# Patient Record
Sex: Female | Born: 1945
Health system: Southern US, Community
[De-identification: ages and names within clinical notes are randomized; demographics above are authoritative.]

## PROBLEM LIST (undated history)

## (undated) DIAGNOSIS — K219 Gastro-esophageal reflux disease without esophagitis: Secondary | ICD-10-CM

## (undated) DIAGNOSIS — R251 Tremor, unspecified: Secondary | ICD-10-CM

## (undated) DIAGNOSIS — E079 Disorder of thyroid, unspecified: Secondary | ICD-10-CM

## (undated) DIAGNOSIS — F329 Major depressive disorder, single episode, unspecified: Secondary | ICD-10-CM

## (undated) DIAGNOSIS — D649 Anemia, unspecified: Secondary | ICD-10-CM

## (undated) DIAGNOSIS — F419 Anxiety disorder, unspecified: Secondary | ICD-10-CM

## (undated) DIAGNOSIS — F32A Depression, unspecified: Secondary | ICD-10-CM

## (undated) DIAGNOSIS — I10 Essential (primary) hypertension: Secondary | ICD-10-CM

## (undated) HISTORY — DX: Depression, unspecified: F32.A

## (undated) HISTORY — PX: MIDDLE EAR SURGERY: SHX713

## (undated) HISTORY — DX: Anemia, unspecified: D64.9

## (undated) HISTORY — DX: Major depressive disorder, single episode, unspecified: F32.9

## (undated) HISTORY — PX: HAND SURGERY: SHX662

## (undated) HISTORY — PX: THYROIDECTOMY: SHX17

## (undated) HISTORY — DX: Essential (primary) hypertension: I10

## (undated) HISTORY — DX: Tremor, unspecified: R25.1

## (undated) HISTORY — PX: OTHER SURGICAL HISTORY: SHX169

## (undated) HISTORY — DX: Gastro-esophageal reflux disease without esophagitis: K21.9

## (undated) HISTORY — PX: FOOT SURGERY: SHX648

## (undated) HISTORY — PX: ABDOMINAL HYSTERECTOMY: SHX81

## (undated) HISTORY — DX: Disorder of thyroid, unspecified: E07.9

## (undated) HISTORY — DX: Anxiety disorder, unspecified: F41.9

## (undated) HISTORY — PX: INNER EAR SURGERY: SHX679

---

## 1981-03-02 HISTORY — PX: ABDOMINAL HYSTERECTOMY: SHX81

## 1988-10-31 HISTORY — PX: THYROIDECTOMY: SHX17

## 2003-12-27 ENCOUNTER — Ambulatory Visit: Payer: Self-pay | Admitting: Obstetrics and Gynecology

## 2004-03-25 ENCOUNTER — Ambulatory Visit: Payer: Self-pay | Admitting: Family Medicine

## 2004-05-20 ENCOUNTER — Ambulatory Visit: Payer: Self-pay | Admitting: General Surgery

## 2004-10-21 ENCOUNTER — Ambulatory Visit: Payer: Self-pay | Admitting: Family Medicine

## 2005-02-17 ENCOUNTER — Ambulatory Visit: Payer: Self-pay | Admitting: Family Medicine

## 2005-05-26 ENCOUNTER — Ambulatory Visit: Payer: Self-pay | Admitting: Family Medicine

## 2005-06-18 ENCOUNTER — Ambulatory Visit: Payer: Self-pay | Admitting: Obstetrics and Gynecology

## 2005-09-25 ENCOUNTER — Ambulatory Visit: Payer: Self-pay | Admitting: Family Medicine

## 2005-10-02 ENCOUNTER — Ambulatory Visit: Payer: Self-pay | Admitting: Family Medicine

## 2006-02-03 ENCOUNTER — Ambulatory Visit: Payer: Self-pay | Admitting: Obstetrics and Gynecology

## 2006-02-10 ENCOUNTER — Ambulatory Visit: Payer: Self-pay | Admitting: Podiatry

## 2006-06-24 ENCOUNTER — Ambulatory Visit: Payer: Self-pay | Admitting: Obstetrics and Gynecology

## 2006-08-02 ENCOUNTER — Ambulatory Visit: Payer: Self-pay | Admitting: Family Medicine

## 2007-03-11 ENCOUNTER — Ambulatory Visit: Payer: Self-pay | Admitting: Family Medicine

## 2007-06-27 ENCOUNTER — Ambulatory Visit: Payer: Self-pay | Admitting: Obstetrics and Gynecology

## 2007-07-08 ENCOUNTER — Ambulatory Visit: Payer: Self-pay | Admitting: Family Medicine

## 2008-07-05 ENCOUNTER — Ambulatory Visit: Payer: Self-pay | Admitting: Obstetrics and Gynecology

## 2009-04-09 ENCOUNTER — Ambulatory Visit: Payer: Self-pay | Admitting: Family Medicine

## 2009-05-30 ENCOUNTER — Ambulatory Visit (HOSPITAL_BASED_OUTPATIENT_CLINIC_OR_DEPARTMENT_OTHER): Admission: RE | Admit: 2009-05-30 | Discharge: 2009-05-30 | Payer: Self-pay | Admitting: Urology

## 2009-07-17 ENCOUNTER — Ambulatory Visit: Payer: Self-pay | Admitting: Obstetrics and Gynecology

## 2009-07-31 ENCOUNTER — Ambulatory Visit: Payer: Self-pay | Admitting: Obstetrics and Gynecology

## 2010-05-26 LAB — POCT HEMOGLOBIN-HEMACUE: Hemoglobin: 10.8 g/dL — ABNORMAL LOW (ref 12.0–15.0)

## 2010-06-20 LAB — HM MAMMOGRAPHY

## 2010-08-13 ENCOUNTER — Ambulatory Visit: Payer: Self-pay | Admitting: Obstetrics and Gynecology

## 2010-08-19 ENCOUNTER — Ambulatory Visit: Payer: Self-pay | Admitting: Obstetrics and Gynecology

## 2011-03-24 DIAGNOSIS — M47812 Spondylosis without myelopathy or radiculopathy, cervical region: Secondary | ICD-10-CM | POA: Diagnosis not present

## 2011-03-24 DIAGNOSIS — M999 Biomechanical lesion, unspecified: Secondary | ICD-10-CM | POA: Diagnosis not present

## 2011-03-24 DIAGNOSIS — M5430 Sciatica, unspecified side: Secondary | ICD-10-CM | POA: Diagnosis not present

## 2011-03-24 DIAGNOSIS — IMO0002 Reserved for concepts with insufficient information to code with codable children: Secondary | ICD-10-CM | POA: Diagnosis not present

## 2011-03-24 DIAGNOSIS — M9981 Other biomechanical lesions of cervical region: Secondary | ICD-10-CM | POA: Diagnosis not present

## 2011-03-25 DIAGNOSIS — M9981 Other biomechanical lesions of cervical region: Secondary | ICD-10-CM | POA: Diagnosis not present

## 2011-03-25 DIAGNOSIS — IMO0002 Reserved for concepts with insufficient information to code with codable children: Secondary | ICD-10-CM | POA: Diagnosis not present

## 2011-03-25 DIAGNOSIS — M5430 Sciatica, unspecified side: Secondary | ICD-10-CM | POA: Diagnosis not present

## 2011-03-25 DIAGNOSIS — M47812 Spondylosis without myelopathy or radiculopathy, cervical region: Secondary | ICD-10-CM | POA: Diagnosis not present

## 2011-03-25 DIAGNOSIS — M999 Biomechanical lesion, unspecified: Secondary | ICD-10-CM | POA: Diagnosis not present

## 2011-03-30 DIAGNOSIS — M47812 Spondylosis without myelopathy or radiculopathy, cervical region: Secondary | ICD-10-CM | POA: Diagnosis not present

## 2011-03-30 DIAGNOSIS — IMO0002 Reserved for concepts with insufficient information to code with codable children: Secondary | ICD-10-CM | POA: Diagnosis not present

## 2011-03-30 DIAGNOSIS — M9981 Other biomechanical lesions of cervical region: Secondary | ICD-10-CM | POA: Diagnosis not present

## 2011-03-30 DIAGNOSIS — M999 Biomechanical lesion, unspecified: Secondary | ICD-10-CM | POA: Diagnosis not present

## 2011-03-30 DIAGNOSIS — M5430 Sciatica, unspecified side: Secondary | ICD-10-CM | POA: Diagnosis not present

## 2011-04-01 DIAGNOSIS — M47812 Spondylosis without myelopathy or radiculopathy, cervical region: Secondary | ICD-10-CM | POA: Diagnosis not present

## 2011-04-01 DIAGNOSIS — M9981 Other biomechanical lesions of cervical region: Secondary | ICD-10-CM | POA: Diagnosis not present

## 2011-04-01 DIAGNOSIS — M999 Biomechanical lesion, unspecified: Secondary | ICD-10-CM | POA: Diagnosis not present

## 2011-04-01 DIAGNOSIS — IMO0002 Reserved for concepts with insufficient information to code with codable children: Secondary | ICD-10-CM | POA: Diagnosis not present

## 2011-04-01 DIAGNOSIS — M5430 Sciatica, unspecified side: Secondary | ICD-10-CM | POA: Diagnosis not present

## 2011-04-06 DIAGNOSIS — M999 Biomechanical lesion, unspecified: Secondary | ICD-10-CM | POA: Diagnosis not present

## 2011-04-06 DIAGNOSIS — IMO0002 Reserved for concepts with insufficient information to code with codable children: Secondary | ICD-10-CM | POA: Diagnosis not present

## 2011-04-06 DIAGNOSIS — M47812 Spondylosis without myelopathy or radiculopathy, cervical region: Secondary | ICD-10-CM | POA: Diagnosis not present

## 2011-04-06 DIAGNOSIS — M9981 Other biomechanical lesions of cervical region: Secondary | ICD-10-CM | POA: Diagnosis not present

## 2011-04-06 DIAGNOSIS — M5430 Sciatica, unspecified side: Secondary | ICD-10-CM | POA: Diagnosis not present

## 2011-04-08 DIAGNOSIS — M47812 Spondylosis without myelopathy or radiculopathy, cervical region: Secondary | ICD-10-CM | POA: Diagnosis not present

## 2011-04-08 DIAGNOSIS — M999 Biomechanical lesion, unspecified: Secondary | ICD-10-CM | POA: Diagnosis not present

## 2011-04-08 DIAGNOSIS — M9981 Other biomechanical lesions of cervical region: Secondary | ICD-10-CM | POA: Diagnosis not present

## 2011-04-08 DIAGNOSIS — IMO0002 Reserved for concepts with insufficient information to code with codable children: Secondary | ICD-10-CM | POA: Diagnosis not present

## 2011-04-08 DIAGNOSIS — M5430 Sciatica, unspecified side: Secondary | ICD-10-CM | POA: Diagnosis not present

## 2011-04-15 DIAGNOSIS — M999 Biomechanical lesion, unspecified: Secondary | ICD-10-CM | POA: Diagnosis not present

## 2011-04-15 DIAGNOSIS — M47812 Spondylosis without myelopathy or radiculopathy, cervical region: Secondary | ICD-10-CM | POA: Diagnosis not present

## 2011-04-15 DIAGNOSIS — IMO0002 Reserved for concepts with insufficient information to code with codable children: Secondary | ICD-10-CM | POA: Diagnosis not present

## 2011-04-15 DIAGNOSIS — M5430 Sciatica, unspecified side: Secondary | ICD-10-CM | POA: Diagnosis not present

## 2011-04-15 DIAGNOSIS — M9981 Other biomechanical lesions of cervical region: Secondary | ICD-10-CM | POA: Diagnosis not present

## 2011-05-12 DIAGNOSIS — M5430 Sciatica, unspecified side: Secondary | ICD-10-CM | POA: Diagnosis not present

## 2011-05-12 DIAGNOSIS — M999 Biomechanical lesion, unspecified: Secondary | ICD-10-CM | POA: Diagnosis not present

## 2011-05-12 DIAGNOSIS — M9981 Other biomechanical lesions of cervical region: Secondary | ICD-10-CM | POA: Diagnosis not present

## 2011-05-12 DIAGNOSIS — M47812 Spondylosis without myelopathy or radiculopathy, cervical region: Secondary | ICD-10-CM | POA: Diagnosis not present

## 2011-05-12 DIAGNOSIS — IMO0002 Reserved for concepts with insufficient information to code with codable children: Secondary | ICD-10-CM | POA: Diagnosis not present

## 2011-05-14 DIAGNOSIS — E039 Hypothyroidism, unspecified: Secondary | ICD-10-CM | POA: Diagnosis not present

## 2011-05-14 DIAGNOSIS — R079 Chest pain, unspecified: Secondary | ICD-10-CM | POA: Diagnosis not present

## 2011-05-14 DIAGNOSIS — Z23 Encounter for immunization: Secondary | ICD-10-CM | POA: Diagnosis not present

## 2011-06-15 DIAGNOSIS — IMO0002 Reserved for concepts with insufficient information to code with codable children: Secondary | ICD-10-CM | POA: Diagnosis not present

## 2011-06-15 DIAGNOSIS — M5430 Sciatica, unspecified side: Secondary | ICD-10-CM | POA: Diagnosis not present

## 2011-06-15 DIAGNOSIS — M47812 Spondylosis without myelopathy or radiculopathy, cervical region: Secondary | ICD-10-CM | POA: Diagnosis not present

## 2011-06-15 DIAGNOSIS — M9981 Other biomechanical lesions of cervical region: Secondary | ICD-10-CM | POA: Diagnosis not present

## 2011-06-15 DIAGNOSIS — M999 Biomechanical lesion, unspecified: Secondary | ICD-10-CM | POA: Diagnosis not present

## 2011-07-07 DIAGNOSIS — R1013 Epigastric pain: Secondary | ICD-10-CM | POA: Diagnosis not present

## 2011-07-07 DIAGNOSIS — R002 Palpitations: Secondary | ICD-10-CM | POA: Diagnosis not present

## 2011-07-07 DIAGNOSIS — R0989 Other specified symptoms and signs involving the circulatory and respiratory systems: Secondary | ICD-10-CM | POA: Diagnosis not present

## 2011-07-07 DIAGNOSIS — Z1211 Encounter for screening for malignant neoplasm of colon: Secondary | ICD-10-CM | POA: Diagnosis not present

## 2011-07-07 DIAGNOSIS — K3189 Other diseases of stomach and duodenum: Secondary | ICD-10-CM | POA: Diagnosis not present

## 2011-07-07 DIAGNOSIS — Z01810 Encounter for preprocedural cardiovascular examination: Secondary | ICD-10-CM | POA: Diagnosis not present

## 2011-07-07 DIAGNOSIS — R0609 Other forms of dyspnea: Secondary | ICD-10-CM | POA: Diagnosis not present

## 2011-07-07 DIAGNOSIS — K219 Gastro-esophageal reflux disease without esophagitis: Secondary | ICD-10-CM | POA: Diagnosis not present

## 2011-07-09 DIAGNOSIS — E782 Mixed hyperlipidemia: Secondary | ICD-10-CM | POA: Diagnosis not present

## 2011-07-09 DIAGNOSIS — R1013 Epigastric pain: Secondary | ICD-10-CM | POA: Diagnosis not present

## 2011-07-09 DIAGNOSIS — I209 Angina pectoris, unspecified: Secondary | ICD-10-CM | POA: Diagnosis not present

## 2011-07-09 DIAGNOSIS — R0602 Shortness of breath: Secondary | ICD-10-CM | POA: Diagnosis not present

## 2011-07-09 DIAGNOSIS — K3189 Other diseases of stomach and duodenum: Secondary | ICD-10-CM | POA: Diagnosis not present

## 2011-07-15 DIAGNOSIS — R0602 Shortness of breath: Secondary | ICD-10-CM | POA: Diagnosis not present

## 2011-07-15 DIAGNOSIS — Z01419 Encounter for gynecological examination (general) (routine) without abnormal findings: Secondary | ICD-10-CM | POA: Diagnosis not present

## 2011-07-15 DIAGNOSIS — Z124 Encounter for screening for malignant neoplasm of cervix: Secondary | ICD-10-CM | POA: Diagnosis not present

## 2011-07-15 DIAGNOSIS — N952 Postmenopausal atrophic vaginitis: Secondary | ICD-10-CM | POA: Diagnosis not present

## 2011-07-15 DIAGNOSIS — F659 Paraphilia, unspecified: Secondary | ICD-10-CM | POA: Diagnosis not present

## 2011-07-21 DIAGNOSIS — D649 Anemia, unspecified: Secondary | ICD-10-CM | POA: Diagnosis not present

## 2011-07-22 DIAGNOSIS — Z1211 Encounter for screening for malignant neoplasm of colon: Secondary | ICD-10-CM | POA: Diagnosis not present

## 2011-07-29 DIAGNOSIS — M9981 Other biomechanical lesions of cervical region: Secondary | ICD-10-CM | POA: Diagnosis not present

## 2011-07-29 DIAGNOSIS — IMO0002 Reserved for concepts with insufficient information to code with codable children: Secondary | ICD-10-CM | POA: Diagnosis not present

## 2011-07-29 DIAGNOSIS — M5430 Sciatica, unspecified side: Secondary | ICD-10-CM | POA: Diagnosis not present

## 2011-07-29 DIAGNOSIS — M47812 Spondylosis without myelopathy or radiculopathy, cervical region: Secondary | ICD-10-CM | POA: Diagnosis not present

## 2011-07-29 DIAGNOSIS — M999 Biomechanical lesion, unspecified: Secondary | ICD-10-CM | POA: Diagnosis not present

## 2011-08-04 ENCOUNTER — Ambulatory Visit: Payer: Self-pay | Admitting: Unknown Physician Specialty

## 2011-08-04 DIAGNOSIS — K2940 Chronic atrophic gastritis without bleeding: Secondary | ICD-10-CM | POA: Diagnosis not present

## 2011-08-04 DIAGNOSIS — D509 Iron deficiency anemia, unspecified: Secondary | ICD-10-CM | POA: Diagnosis not present

## 2011-08-04 DIAGNOSIS — Z79899 Other long term (current) drug therapy: Secondary | ICD-10-CM | POA: Diagnosis not present

## 2011-08-04 DIAGNOSIS — Z6828 Body mass index (BMI) 28.0-28.9, adult: Secondary | ICD-10-CM | POA: Diagnosis not present

## 2011-08-04 DIAGNOSIS — Z87891 Personal history of nicotine dependence: Secondary | ICD-10-CM | POA: Diagnosis not present

## 2011-08-04 DIAGNOSIS — E039 Hypothyroidism, unspecified: Secondary | ICD-10-CM | POA: Diagnosis not present

## 2011-08-04 DIAGNOSIS — R079 Chest pain, unspecified: Secondary | ICD-10-CM | POA: Diagnosis not present

## 2011-08-04 DIAGNOSIS — K449 Diaphragmatic hernia without obstruction or gangrene: Secondary | ICD-10-CM | POA: Diagnosis not present

## 2011-08-04 DIAGNOSIS — E890 Postprocedural hypothyroidism: Secondary | ICD-10-CM | POA: Diagnosis not present

## 2011-08-04 DIAGNOSIS — Z7982 Long term (current) use of aspirin: Secondary | ICD-10-CM | POA: Diagnosis not present

## 2011-08-04 DIAGNOSIS — K2990 Gastroduodenitis, unspecified, without bleeding: Secondary | ICD-10-CM | POA: Diagnosis not present

## 2011-08-04 DIAGNOSIS — K219 Gastro-esophageal reflux disease without esophagitis: Secondary | ICD-10-CM | POA: Diagnosis not present

## 2011-08-04 DIAGNOSIS — K6389 Other specified diseases of intestine: Secondary | ICD-10-CM | POA: Diagnosis not present

## 2011-08-04 DIAGNOSIS — K5730 Diverticulosis of large intestine without perforation or abscess without bleeding: Secondary | ICD-10-CM | POA: Diagnosis not present

## 2011-08-04 DIAGNOSIS — K648 Other hemorrhoids: Secondary | ICD-10-CM | POA: Diagnosis not present

## 2011-08-04 DIAGNOSIS — R12 Heartburn: Secondary | ICD-10-CM | POA: Diagnosis not present

## 2011-08-04 DIAGNOSIS — K2980 Duodenitis without bleeding: Secondary | ICD-10-CM | POA: Diagnosis not present

## 2011-08-04 LAB — HM COLONOSCOPY

## 2011-08-06 DIAGNOSIS — D649 Anemia, unspecified: Secondary | ICD-10-CM | POA: Diagnosis not present

## 2011-08-06 DIAGNOSIS — Z23 Encounter for immunization: Secondary | ICD-10-CM | POA: Diagnosis not present

## 2011-08-06 DIAGNOSIS — IMO0001 Reserved for inherently not codable concepts without codable children: Secondary | ICD-10-CM | POA: Diagnosis not present

## 2011-08-06 DIAGNOSIS — R51 Headache: Secondary | ICD-10-CM | POA: Diagnosis not present

## 2011-08-06 LAB — PATHOLOGY REPORT

## 2011-08-17 ENCOUNTER — Ambulatory Visit: Payer: Self-pay | Admitting: Obstetrics and Gynecology

## 2011-08-17 DIAGNOSIS — Z1231 Encounter for screening mammogram for malignant neoplasm of breast: Secondary | ICD-10-CM | POA: Diagnosis not present

## 2011-08-18 DIAGNOSIS — IMO0002 Reserved for concepts with insufficient information to code with codable children: Secondary | ICD-10-CM | POA: Diagnosis not present

## 2011-08-18 DIAGNOSIS — M999 Biomechanical lesion, unspecified: Secondary | ICD-10-CM | POA: Diagnosis not present

## 2011-08-18 DIAGNOSIS — M47812 Spondylosis without myelopathy or radiculopathy, cervical region: Secondary | ICD-10-CM | POA: Diagnosis not present

## 2011-08-18 DIAGNOSIS — M5430 Sciatica, unspecified side: Secondary | ICD-10-CM | POA: Diagnosis not present

## 2011-08-18 DIAGNOSIS — M9981 Other biomechanical lesions of cervical region: Secondary | ICD-10-CM | POA: Diagnosis not present

## 2011-08-20 DIAGNOSIS — K449 Diaphragmatic hernia without obstruction or gangrene: Secondary | ICD-10-CM | POA: Diagnosis not present

## 2011-09-01 DIAGNOSIS — K219 Gastro-esophageal reflux disease without esophagitis: Secondary | ICD-10-CM | POA: Diagnosis not present

## 2011-09-01 DIAGNOSIS — K449 Diaphragmatic hernia without obstruction or gangrene: Secondary | ICD-10-CM | POA: Diagnosis not present

## 2011-09-07 ENCOUNTER — Ambulatory Visit: Payer: Self-pay | Admitting: General Surgery

## 2011-09-07 DIAGNOSIS — R1310 Dysphagia, unspecified: Secondary | ICD-10-CM | POA: Diagnosis not present

## 2011-09-14 DIAGNOSIS — K449 Diaphragmatic hernia without obstruction or gangrene: Secondary | ICD-10-CM | POA: Diagnosis not present

## 2011-09-17 DIAGNOSIS — IMO0002 Reserved for concepts with insufficient information to code with codable children: Secondary | ICD-10-CM | POA: Diagnosis not present

## 2011-09-17 DIAGNOSIS — M47812 Spondylosis without myelopathy or radiculopathy, cervical region: Secondary | ICD-10-CM | POA: Diagnosis not present

## 2011-09-17 DIAGNOSIS — M999 Biomechanical lesion, unspecified: Secondary | ICD-10-CM | POA: Diagnosis not present

## 2011-09-17 DIAGNOSIS — M5430 Sciatica, unspecified side: Secondary | ICD-10-CM | POA: Diagnosis not present

## 2011-09-17 DIAGNOSIS — M9981 Other biomechanical lesions of cervical region: Secondary | ICD-10-CM | POA: Diagnosis not present

## 2011-11-03 DIAGNOSIS — R490 Dysphonia: Secondary | ICD-10-CM | POA: Insufficient documentation

## 2011-11-03 DIAGNOSIS — J3801 Paralysis of vocal cords and larynx, unilateral: Secondary | ICD-10-CM | POA: Diagnosis not present

## 2011-11-03 DIAGNOSIS — J385 Laryngeal spasm: Secondary | ICD-10-CM | POA: Diagnosis not present

## 2011-11-05 DIAGNOSIS — D509 Iron deficiency anemia, unspecified: Secondary | ICD-10-CM | POA: Diagnosis not present

## 2011-11-11 DIAGNOSIS — M5430 Sciatica, unspecified side: Secondary | ICD-10-CM | POA: Diagnosis not present

## 2011-11-11 DIAGNOSIS — M47812 Spondylosis without myelopathy or radiculopathy, cervical region: Secondary | ICD-10-CM | POA: Diagnosis not present

## 2011-11-11 DIAGNOSIS — M9981 Other biomechanical lesions of cervical region: Secondary | ICD-10-CM | POA: Diagnosis not present

## 2011-11-11 DIAGNOSIS — IMO0002 Reserved for concepts with insufficient information to code with codable children: Secondary | ICD-10-CM | POA: Diagnosis not present

## 2011-11-11 DIAGNOSIS — M999 Biomechanical lesion, unspecified: Secondary | ICD-10-CM | POA: Diagnosis not present

## 2011-11-19 DIAGNOSIS — M9981 Other biomechanical lesions of cervical region: Secondary | ICD-10-CM | POA: Diagnosis not present

## 2011-11-19 DIAGNOSIS — IMO0002 Reserved for concepts with insufficient information to code with codable children: Secondary | ICD-10-CM | POA: Diagnosis not present

## 2011-11-19 DIAGNOSIS — M47812 Spondylosis without myelopathy or radiculopathy, cervical region: Secondary | ICD-10-CM | POA: Diagnosis not present

## 2011-11-19 DIAGNOSIS — M5430 Sciatica, unspecified side: Secondary | ICD-10-CM | POA: Diagnosis not present

## 2011-11-19 DIAGNOSIS — M999 Biomechanical lesion, unspecified: Secondary | ICD-10-CM | POA: Diagnosis not present

## 2011-11-26 DIAGNOSIS — H26109 Unspecified traumatic cataract, unspecified eye: Secondary | ICD-10-CM | POA: Diagnosis not present

## 2011-12-14 DIAGNOSIS — M999 Biomechanical lesion, unspecified: Secondary | ICD-10-CM | POA: Diagnosis not present

## 2011-12-14 DIAGNOSIS — M5430 Sciatica, unspecified side: Secondary | ICD-10-CM | POA: Diagnosis not present

## 2011-12-14 DIAGNOSIS — M47812 Spondylosis without myelopathy or radiculopathy, cervical region: Secondary | ICD-10-CM | POA: Diagnosis not present

## 2011-12-14 DIAGNOSIS — IMO0002 Reserved for concepts with insufficient information to code with codable children: Secondary | ICD-10-CM | POA: Diagnosis not present

## 2011-12-14 DIAGNOSIS — M9981 Other biomechanical lesions of cervical region: Secondary | ICD-10-CM | POA: Diagnosis not present

## 2011-12-16 DIAGNOSIS — M5430 Sciatica, unspecified side: Secondary | ICD-10-CM | POA: Diagnosis not present

## 2011-12-16 DIAGNOSIS — M999 Biomechanical lesion, unspecified: Secondary | ICD-10-CM | POA: Diagnosis not present

## 2011-12-16 DIAGNOSIS — M47812 Spondylosis without myelopathy or radiculopathy, cervical region: Secondary | ICD-10-CM | POA: Diagnosis not present

## 2011-12-16 DIAGNOSIS — M9981 Other biomechanical lesions of cervical region: Secondary | ICD-10-CM | POA: Diagnosis not present

## 2011-12-16 DIAGNOSIS — IMO0002 Reserved for concepts with insufficient information to code with codable children: Secondary | ICD-10-CM | POA: Diagnosis not present

## 2011-12-22 DIAGNOSIS — F411 Generalized anxiety disorder: Secondary | ICD-10-CM | POA: Diagnosis not present

## 2011-12-22 DIAGNOSIS — F329 Major depressive disorder, single episode, unspecified: Secondary | ICD-10-CM | POA: Diagnosis not present

## 2011-12-22 DIAGNOSIS — D649 Anemia, unspecified: Secondary | ICD-10-CM | POA: Diagnosis not present

## 2011-12-22 DIAGNOSIS — E039 Hypothyroidism, unspecified: Secondary | ICD-10-CM | POA: Diagnosis not present

## 2011-12-23 DIAGNOSIS — M999 Biomechanical lesion, unspecified: Secondary | ICD-10-CM | POA: Diagnosis not present

## 2011-12-23 DIAGNOSIS — IMO0002 Reserved for concepts with insufficient information to code with codable children: Secondary | ICD-10-CM | POA: Diagnosis not present

## 2011-12-23 DIAGNOSIS — M47812 Spondylosis without myelopathy or radiculopathy, cervical region: Secondary | ICD-10-CM | POA: Diagnosis not present

## 2011-12-23 DIAGNOSIS — M5430 Sciatica, unspecified side: Secondary | ICD-10-CM | POA: Diagnosis not present

## 2011-12-23 DIAGNOSIS — M9981 Other biomechanical lesions of cervical region: Secondary | ICD-10-CM | POA: Diagnosis not present

## 2011-12-30 DIAGNOSIS — M5430 Sciatica, unspecified side: Secondary | ICD-10-CM | POA: Diagnosis not present

## 2011-12-30 DIAGNOSIS — IMO0002 Reserved for concepts with insufficient information to code with codable children: Secondary | ICD-10-CM | POA: Diagnosis not present

## 2011-12-30 DIAGNOSIS — M9981 Other biomechanical lesions of cervical region: Secondary | ICD-10-CM | POA: Diagnosis not present

## 2011-12-30 DIAGNOSIS — M999 Biomechanical lesion, unspecified: Secondary | ICD-10-CM | POA: Diagnosis not present

## 2011-12-30 DIAGNOSIS — M47812 Spondylosis without myelopathy or radiculopathy, cervical region: Secondary | ICD-10-CM | POA: Diagnosis not present

## 2012-01-04 DIAGNOSIS — Z23 Encounter for immunization: Secondary | ICD-10-CM | POA: Diagnosis not present

## 2012-01-05 DIAGNOSIS — M47812 Spondylosis without myelopathy or radiculopathy, cervical region: Secondary | ICD-10-CM | POA: Diagnosis not present

## 2012-01-05 DIAGNOSIS — IMO0002 Reserved for concepts with insufficient information to code with codable children: Secondary | ICD-10-CM | POA: Diagnosis not present

## 2012-01-05 DIAGNOSIS — M9981 Other biomechanical lesions of cervical region: Secondary | ICD-10-CM | POA: Diagnosis not present

## 2012-01-05 DIAGNOSIS — M999 Biomechanical lesion, unspecified: Secondary | ICD-10-CM | POA: Diagnosis not present

## 2012-01-05 DIAGNOSIS — M5430 Sciatica, unspecified side: Secondary | ICD-10-CM | POA: Diagnosis not present

## 2012-01-07 DIAGNOSIS — G4700 Insomnia, unspecified: Secondary | ICD-10-CM | POA: Diagnosis not present

## 2012-01-07 DIAGNOSIS — F411 Generalized anxiety disorder: Secondary | ICD-10-CM | POA: Diagnosis not present

## 2012-01-07 DIAGNOSIS — F329 Major depressive disorder, single episode, unspecified: Secondary | ICD-10-CM | POA: Diagnosis not present

## 2012-01-12 ENCOUNTER — Ambulatory Visit: Payer: Self-pay | Admitting: Family Medicine

## 2012-01-12 DIAGNOSIS — M5137 Other intervertebral disc degeneration, lumbosacral region: Secondary | ICD-10-CM | POA: Diagnosis not present

## 2012-01-18 DIAGNOSIS — M549 Dorsalgia, unspecified: Secondary | ICD-10-CM | POA: Diagnosis not present

## 2012-01-19 DIAGNOSIS — E039 Hypothyroidism, unspecified: Secondary | ICD-10-CM | POA: Diagnosis not present

## 2012-01-19 DIAGNOSIS — I10 Essential (primary) hypertension: Secondary | ICD-10-CM | POA: Diagnosis not present

## 2012-01-19 DIAGNOSIS — E785 Hyperlipidemia, unspecified: Secondary | ICD-10-CM | POA: Diagnosis not present

## 2012-01-22 DIAGNOSIS — M549 Dorsalgia, unspecified: Secondary | ICD-10-CM | POA: Diagnosis not present

## 2012-01-26 DIAGNOSIS — M549 Dorsalgia, unspecified: Secondary | ICD-10-CM | POA: Diagnosis not present

## 2012-03-24 DIAGNOSIS — H2510 Age-related nuclear cataract, unspecified eye: Secondary | ICD-10-CM | POA: Diagnosis not present

## 2012-04-12 DIAGNOSIS — F411 Generalized anxiety disorder: Secondary | ICD-10-CM | POA: Diagnosis not present

## 2012-04-12 DIAGNOSIS — E039 Hypothyroidism, unspecified: Secondary | ICD-10-CM | POA: Diagnosis not present

## 2012-04-12 DIAGNOSIS — G4700 Insomnia, unspecified: Secondary | ICD-10-CM | POA: Diagnosis not present

## 2012-04-12 DIAGNOSIS — F329 Major depressive disorder, single episode, unspecified: Secondary | ICD-10-CM | POA: Diagnosis not present

## 2012-04-18 DIAGNOSIS — M999 Biomechanical lesion, unspecified: Secondary | ICD-10-CM | POA: Diagnosis not present

## 2012-04-18 DIAGNOSIS — M9981 Other biomechanical lesions of cervical region: Secondary | ICD-10-CM | POA: Diagnosis not present

## 2012-04-18 DIAGNOSIS — R51 Headache: Secondary | ICD-10-CM | POA: Diagnosis not present

## 2012-04-18 DIAGNOSIS — IMO0002 Reserved for concepts with insufficient information to code with codable children: Secondary | ICD-10-CM | POA: Diagnosis not present

## 2012-04-19 DIAGNOSIS — E7800 Pure hypercholesterolemia, unspecified: Secondary | ICD-10-CM | POA: Diagnosis not present

## 2012-04-19 DIAGNOSIS — Z79899 Other long term (current) drug therapy: Secondary | ICD-10-CM | POA: Diagnosis not present

## 2012-04-19 DIAGNOSIS — E039 Hypothyroidism, unspecified: Secondary | ICD-10-CM | POA: Diagnosis not present

## 2012-04-19 DIAGNOSIS — Z1159 Encounter for screening for other viral diseases: Secondary | ICD-10-CM | POA: Diagnosis not present

## 2012-04-20 DIAGNOSIS — M9981 Other biomechanical lesions of cervical region: Secondary | ICD-10-CM | POA: Diagnosis not present

## 2012-04-20 DIAGNOSIS — M999 Biomechanical lesion, unspecified: Secondary | ICD-10-CM | POA: Diagnosis not present

## 2012-04-26 DIAGNOSIS — R51 Headache: Secondary | ICD-10-CM | POA: Diagnosis not present

## 2012-04-26 DIAGNOSIS — M999 Biomechanical lesion, unspecified: Secondary | ICD-10-CM | POA: Diagnosis not present

## 2012-05-24 DIAGNOSIS — H2510 Age-related nuclear cataract, unspecified eye: Secondary | ICD-10-CM | POA: Diagnosis not present

## 2012-05-30 DIAGNOSIS — M129 Arthropathy, unspecified: Secondary | ICD-10-CM | POA: Diagnosis not present

## 2012-05-30 DIAGNOSIS — J309 Allergic rhinitis, unspecified: Secondary | ICD-10-CM | POA: Diagnosis not present

## 2012-05-30 DIAGNOSIS — I10 Essential (primary) hypertension: Secondary | ICD-10-CM | POA: Diagnosis not present

## 2012-06-08 DIAGNOSIS — L821 Other seborrheic keratosis: Secondary | ICD-10-CM | POA: Diagnosis not present

## 2012-06-08 DIAGNOSIS — L570 Actinic keratosis: Secondary | ICD-10-CM | POA: Diagnosis not present

## 2012-06-17 DIAGNOSIS — E7800 Pure hypercholesterolemia, unspecified: Secondary | ICD-10-CM | POA: Diagnosis not present

## 2012-06-17 DIAGNOSIS — I10 Essential (primary) hypertension: Secondary | ICD-10-CM | POA: Diagnosis not present

## 2012-06-28 DIAGNOSIS — R51 Headache: Secondary | ICD-10-CM | POA: Diagnosis not present

## 2012-06-28 DIAGNOSIS — M999 Biomechanical lesion, unspecified: Secondary | ICD-10-CM | POA: Diagnosis not present

## 2012-06-28 DIAGNOSIS — IMO0002 Reserved for concepts with insufficient information to code with codable children: Secondary | ICD-10-CM | POA: Diagnosis not present

## 2012-06-28 DIAGNOSIS — M9981 Other biomechanical lesions of cervical region: Secondary | ICD-10-CM | POA: Diagnosis not present

## 2012-07-11 DIAGNOSIS — I10 Essential (primary) hypertension: Secondary | ICD-10-CM | POA: Diagnosis not present

## 2012-07-11 DIAGNOSIS — F411 Generalized anxiety disorder: Secondary | ICD-10-CM | POA: Diagnosis not present

## 2012-07-11 DIAGNOSIS — F329 Major depressive disorder, single episode, unspecified: Secondary | ICD-10-CM | POA: Diagnosis not present

## 2012-07-11 DIAGNOSIS — E669 Obesity, unspecified: Secondary | ICD-10-CM | POA: Diagnosis not present

## 2012-07-19 IMAGING — RF DG BARIUM SWALLOW
6 series · 14 of 21 positions shown · non-contrast
Comparison: none

REASON FOR EXAM: dysphagia
COMMENTS:

PROCEDURE:     FL  - FL BARIUM SWALLOW  - September 07, 2011  [DATE]
RESULT:     Comparison: None
INDICATION: Dysphagia
TECHNIQUE: Multiple single and double-contrast images of the esophagus were
obtained under fluoroscopic guidance. Total fluoroscopy time was 0.9 minutes.

[Series 1: fluoro_barium 2fps_bw · 0.17mm/px · 3 of 10 frames shown (1 of 6)]
[frame 2/10]
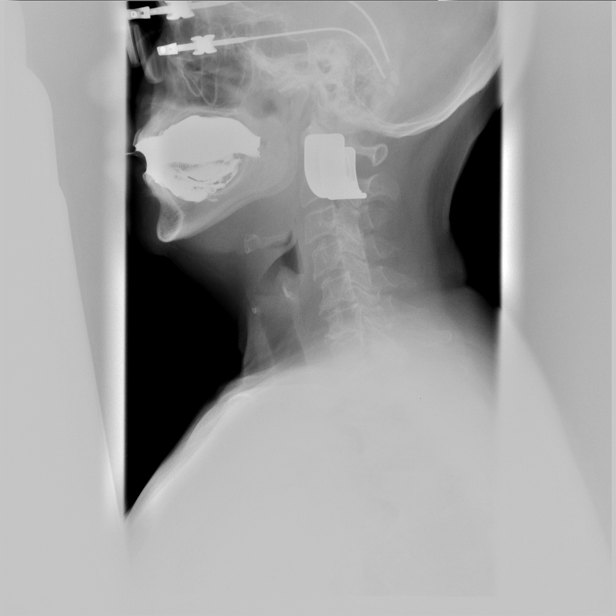
[frame 6/10]
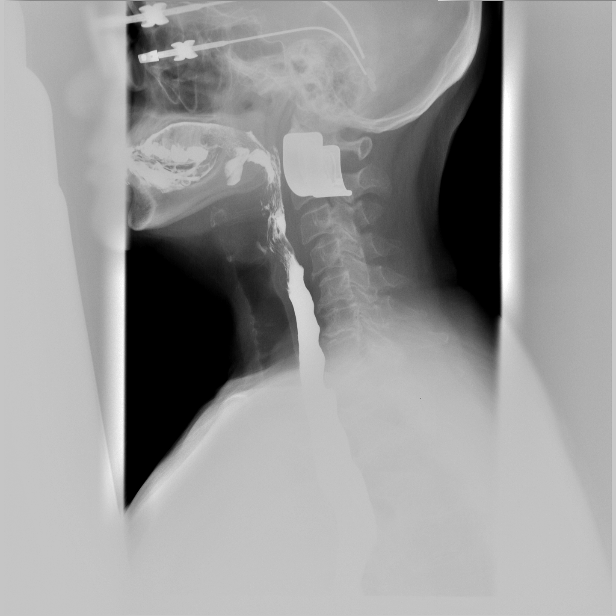
[frame 9/10]
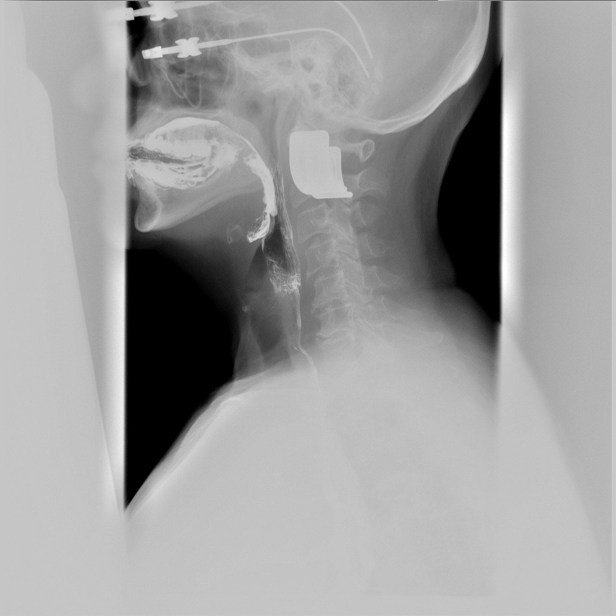

[Series 2: fluoro_barium 2fps_bw · 0.17mm/px · 2 of 31 frames shown (2 of 6)]
[frame 16/31]
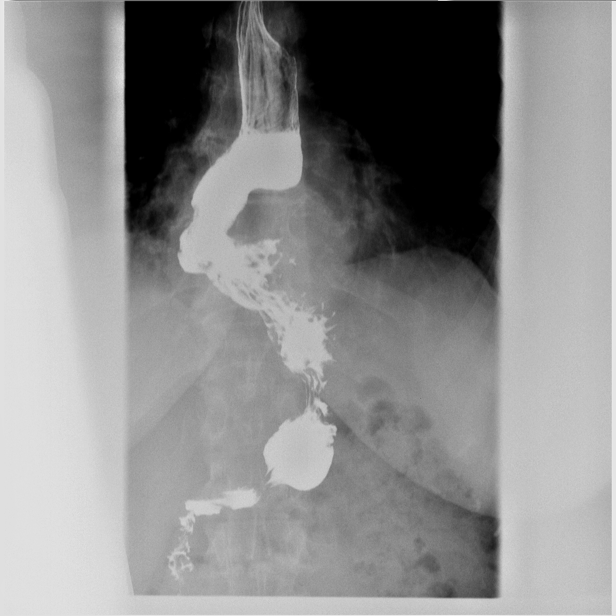
[frame 27/31]
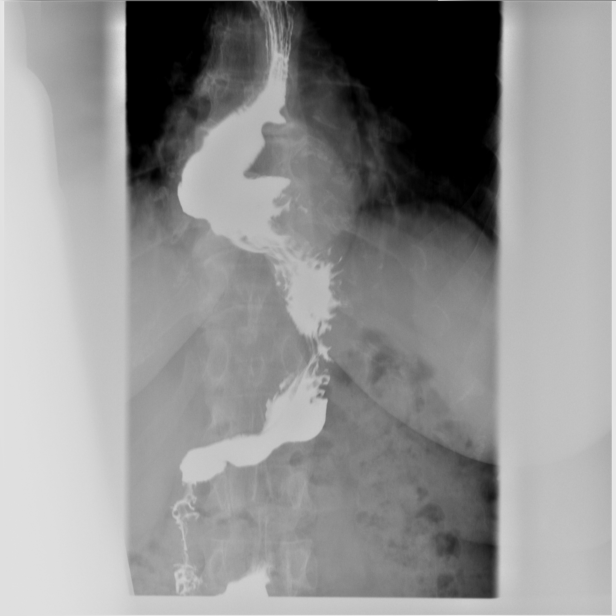

[Series 3: fluoro_barium 2fps_bw · 0.17mm/px · 3 of 28 frames shown (3 of 6)]
[frame 5/28]
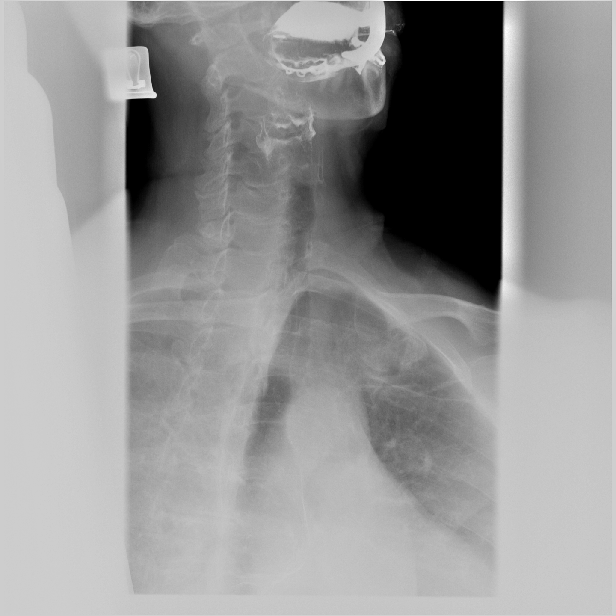
[frame 13/28]
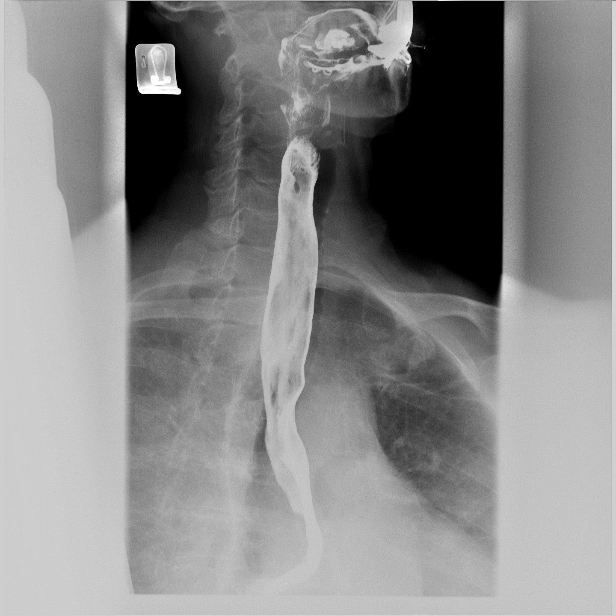
[frame 24/28]
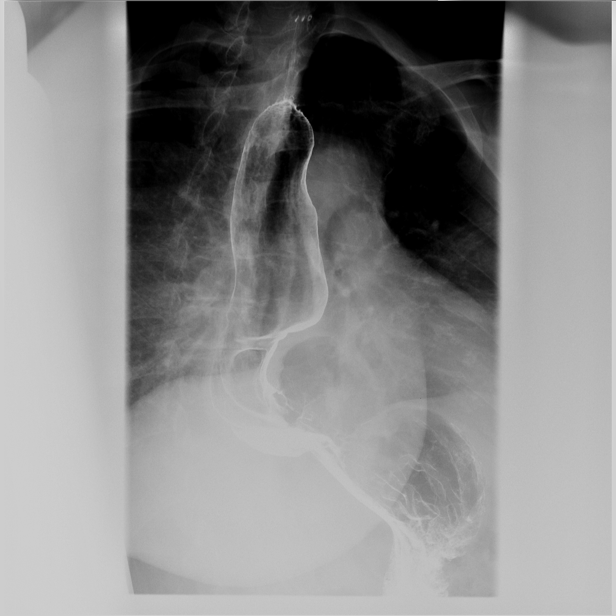

[Series 4: fluoro_barium 2fps_bw · 0.17mm/px · 1 of 2 frames shown (4 of 6)]
[frame 1/2]
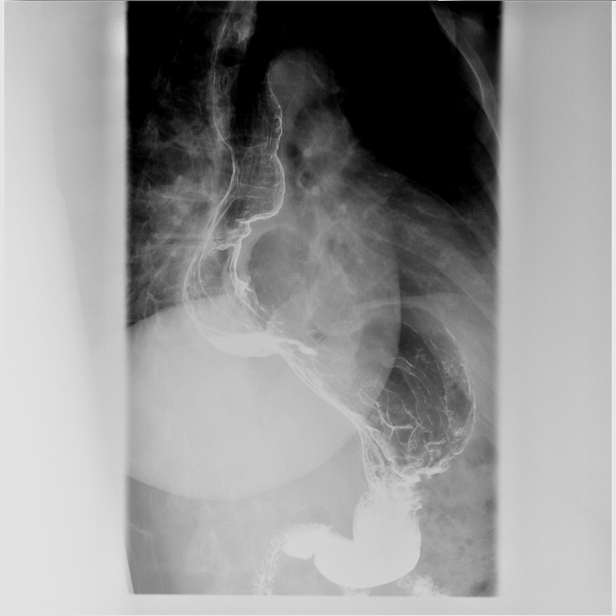

[Series 5: fluoro_barium 2fps_bw · 0.17mm/px · 2 of 36 frames shown (5 of 6)]
[frame 6/36]
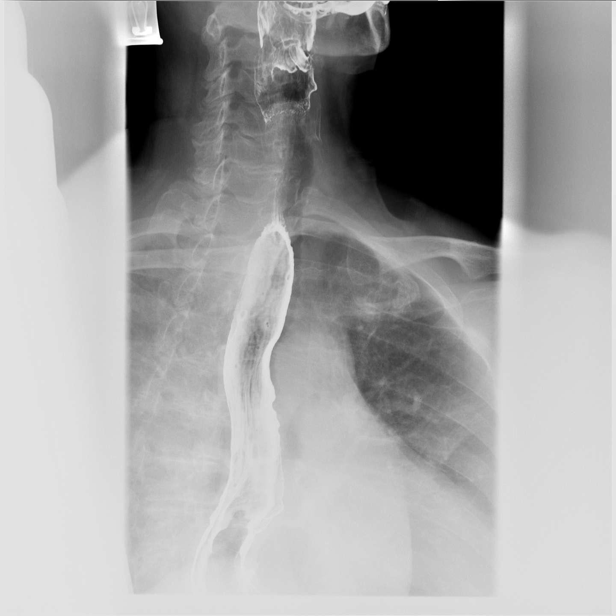
[frame 19/36]
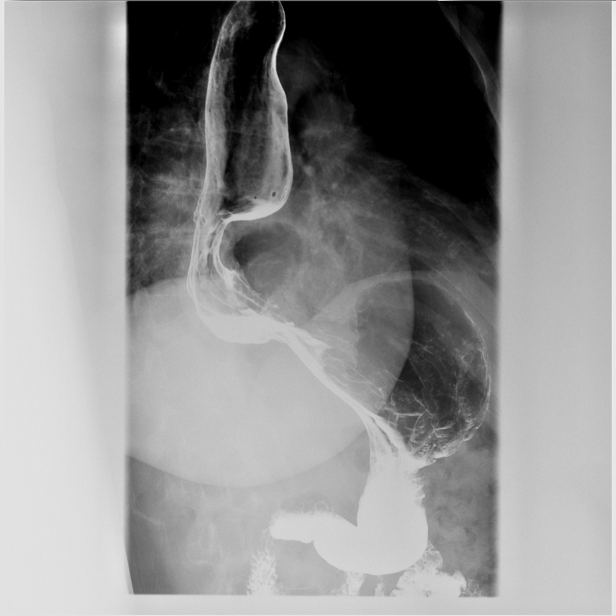

[Series 6: fluoro_barium 2fps_bw · 0.17mm/px · 3 of 32 frames shown (6 of 6)]
[frame 5/32]
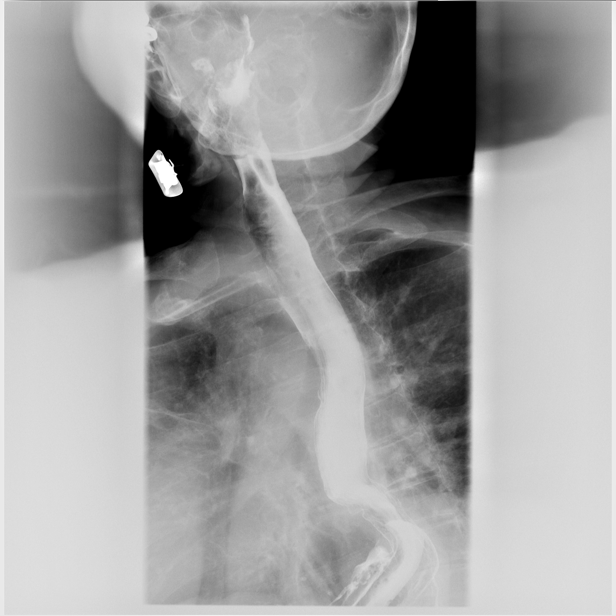
[frame 9/32]
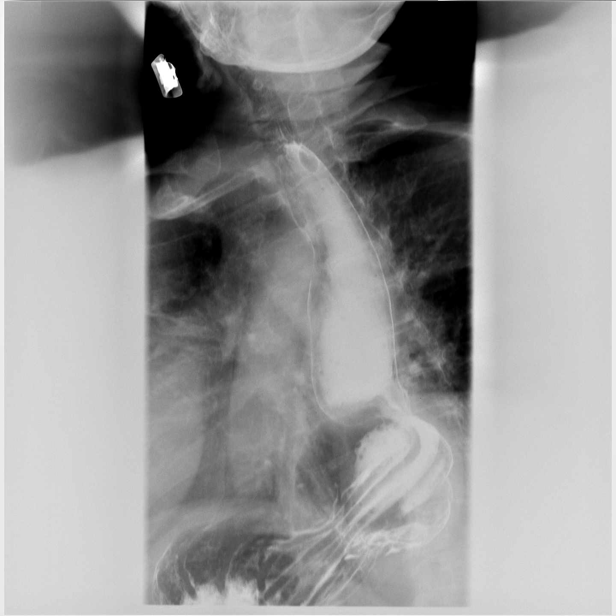
[frame 28/32]
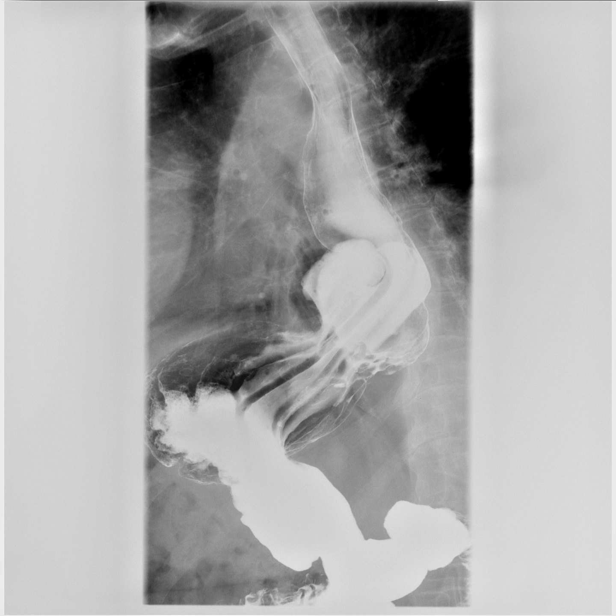

[14 of 21 positions shown; findings below may reference images not displayed]

FINDINGS: There was normal pharyngeal anatomy and motility. Contrast flowed
freely through the esophagus without evidence of stricture or mass. There
was normal esophageal mucosa without evidence of irregularity or ulceration.
 There is a large hiatal hernia in the upright position without restriction
of flow of the barium column. There is severe gastroesophageal reflux. There
is a feline appearance of the distal third of the esophagus as can be seen
with gastroesophageal reflux.

At the end of the examination a 12.5mm barium tablet was administered which
transited through the esophagus and esophagogastric junction without delay.
IMPRESSION: 1. Please see above.

[REDACTED]

## 2012-09-09 DIAGNOSIS — M6530 Trigger finger, unspecified finger: Secondary | ICD-10-CM | POA: Diagnosis not present

## 2012-11-08 DIAGNOSIS — E669 Obesity, unspecified: Secondary | ICD-10-CM | POA: Diagnosis not present

## 2012-11-08 DIAGNOSIS — Z1339 Encounter for screening examination for other mental health and behavioral disorders: Secondary | ICD-10-CM | POA: Diagnosis not present

## 2012-11-08 DIAGNOSIS — Z0000 Encounter for general adult medical examination without abnormal findings: Secondary | ICD-10-CM | POA: Diagnosis not present

## 2012-11-08 DIAGNOSIS — Z1331 Encounter for screening for depression: Secondary | ICD-10-CM | POA: Diagnosis not present

## 2012-11-08 DIAGNOSIS — I10 Essential (primary) hypertension: Secondary | ICD-10-CM | POA: Diagnosis not present

## 2012-11-08 DIAGNOSIS — F329 Major depressive disorder, single episode, unspecified: Secondary | ICD-10-CM | POA: Diagnosis not present

## 2012-11-15 DIAGNOSIS — M9981 Other biomechanical lesions of cervical region: Secondary | ICD-10-CM | POA: Diagnosis not present

## 2012-11-15 DIAGNOSIS — IMO0002 Reserved for concepts with insufficient information to code with codable children: Secondary | ICD-10-CM | POA: Diagnosis not present

## 2012-11-15 DIAGNOSIS — R51 Headache: Secondary | ICD-10-CM | POA: Diagnosis not present

## 2012-11-15 DIAGNOSIS — M999 Biomechanical lesion, unspecified: Secondary | ICD-10-CM | POA: Diagnosis not present

## 2012-11-18 DIAGNOSIS — M999 Biomechanical lesion, unspecified: Secondary | ICD-10-CM | POA: Diagnosis not present

## 2012-11-18 DIAGNOSIS — M9981 Other biomechanical lesions of cervical region: Secondary | ICD-10-CM | POA: Diagnosis not present

## 2012-11-18 DIAGNOSIS — IMO0002 Reserved for concepts with insufficient information to code with codable children: Secondary | ICD-10-CM | POA: Diagnosis not present

## 2012-11-18 DIAGNOSIS — R51 Headache: Secondary | ICD-10-CM | POA: Diagnosis not present

## 2012-11-23 DIAGNOSIS — M9981 Other biomechanical lesions of cervical region: Secondary | ICD-10-CM | POA: Diagnosis not present

## 2012-11-23 DIAGNOSIS — IMO0002 Reserved for concepts with insufficient information to code with codable children: Secondary | ICD-10-CM | POA: Diagnosis not present

## 2012-11-23 DIAGNOSIS — M999 Biomechanical lesion, unspecified: Secondary | ICD-10-CM | POA: Diagnosis not present

## 2012-11-23 DIAGNOSIS — R51 Headache: Secondary | ICD-10-CM | POA: Diagnosis not present

## 2012-11-28 DIAGNOSIS — IMO0002 Reserved for concepts with insufficient information to code with codable children: Secondary | ICD-10-CM | POA: Diagnosis not present

## 2012-11-28 DIAGNOSIS — M9981 Other biomechanical lesions of cervical region: Secondary | ICD-10-CM | POA: Diagnosis not present

## 2012-11-28 DIAGNOSIS — R51 Headache: Secondary | ICD-10-CM | POA: Diagnosis not present

## 2012-11-28 DIAGNOSIS — M999 Biomechanical lesion, unspecified: Secondary | ICD-10-CM | POA: Diagnosis not present

## 2012-12-07 DIAGNOSIS — M9981 Other biomechanical lesions of cervical region: Secondary | ICD-10-CM | POA: Diagnosis not present

## 2012-12-07 DIAGNOSIS — R51 Headache: Secondary | ICD-10-CM | POA: Diagnosis not present

## 2012-12-07 DIAGNOSIS — IMO0002 Reserved for concepts with insufficient information to code with codable children: Secondary | ICD-10-CM | POA: Diagnosis not present

## 2012-12-07 DIAGNOSIS — Z23 Encounter for immunization: Secondary | ICD-10-CM | POA: Diagnosis not present

## 2012-12-07 DIAGNOSIS — M999 Biomechanical lesion, unspecified: Secondary | ICD-10-CM | POA: Diagnosis not present

## 2013-01-03 DIAGNOSIS — IMO0002 Reserved for concepts with insufficient information to code with codable children: Secondary | ICD-10-CM | POA: Diagnosis not present

## 2013-01-03 DIAGNOSIS — M9981 Other biomechanical lesions of cervical region: Secondary | ICD-10-CM | POA: Diagnosis not present

## 2013-01-03 DIAGNOSIS — M999 Biomechanical lesion, unspecified: Secondary | ICD-10-CM | POA: Diagnosis not present

## 2013-01-03 DIAGNOSIS — R51 Headache: Secondary | ICD-10-CM | POA: Diagnosis not present

## 2013-02-27 DIAGNOSIS — M9981 Other biomechanical lesions of cervical region: Secondary | ICD-10-CM | POA: Diagnosis not present

## 2013-02-27 DIAGNOSIS — R51 Headache: Secondary | ICD-10-CM | POA: Diagnosis not present

## 2013-02-27 DIAGNOSIS — M999 Biomechanical lesion, unspecified: Secondary | ICD-10-CM | POA: Diagnosis not present

## 2013-02-27 DIAGNOSIS — IMO0002 Reserved for concepts with insufficient information to code with codable children: Secondary | ICD-10-CM | POA: Diagnosis not present

## 2013-03-06 DIAGNOSIS — E039 Hypothyroidism, unspecified: Secondary | ICD-10-CM | POA: Diagnosis not present

## 2013-03-06 DIAGNOSIS — Z01419 Encounter for gynecological examination (general) (routine) without abnormal findings: Secondary | ICD-10-CM | POA: Diagnosis not present

## 2013-03-06 DIAGNOSIS — N951 Menopausal and female climacteric states: Secondary | ICD-10-CM | POA: Diagnosis not present

## 2013-03-06 DIAGNOSIS — I10 Essential (primary) hypertension: Secondary | ICD-10-CM | POA: Diagnosis not present

## 2013-03-06 DIAGNOSIS — Z1211 Encounter for screening for malignant neoplasm of colon: Secondary | ICD-10-CM | POA: Diagnosis not present

## 2013-03-06 DIAGNOSIS — B379 Candidiasis, unspecified: Secondary | ICD-10-CM | POA: Diagnosis not present

## 2013-03-16 ENCOUNTER — Ambulatory Visit: Payer: Self-pay | Admitting: Obstetrics and Gynecology

## 2013-03-16 DIAGNOSIS — I10 Essential (primary) hypertension: Secondary | ICD-10-CM | POA: Diagnosis not present

## 2013-03-16 DIAGNOSIS — E039 Hypothyroidism, unspecified: Secondary | ICD-10-CM | POA: Diagnosis not present

## 2013-03-16 DIAGNOSIS — F320 Major depressive disorder, single episode, mild: Secondary | ICD-10-CM | POA: Diagnosis not present

## 2013-03-16 DIAGNOSIS — E669 Obesity, unspecified: Secondary | ICD-10-CM | POA: Diagnosis not present

## 2013-03-16 DIAGNOSIS — Z1231 Encounter for screening mammogram for malignant neoplasm of breast: Secondary | ICD-10-CM | POA: Diagnosis not present

## 2013-03-16 DIAGNOSIS — F411 Generalized anxiety disorder: Secondary | ICD-10-CM | POA: Diagnosis not present

## 2013-03-17 DIAGNOSIS — E039 Hypothyroidism, unspecified: Secondary | ICD-10-CM | POA: Diagnosis not present

## 2013-03-30 DIAGNOSIS — H35039 Hypertensive retinopathy, unspecified eye: Secondary | ICD-10-CM | POA: Diagnosis not present

## 2013-03-30 DIAGNOSIS — H2510 Age-related nuclear cataract, unspecified eye: Secondary | ICD-10-CM | POA: Diagnosis not present

## 2013-05-03 DIAGNOSIS — H353190 Nonexudative age-related macular degeneration, unspecified eye, stage unspecified: Secondary | ICD-10-CM | POA: Diagnosis not present

## 2013-05-08 DIAGNOSIS — R51 Headache: Secondary | ICD-10-CM | POA: Diagnosis not present

## 2013-05-08 DIAGNOSIS — M9981 Other biomechanical lesions of cervical region: Secondary | ICD-10-CM | POA: Diagnosis not present

## 2013-05-08 DIAGNOSIS — IMO0002 Reserved for concepts with insufficient information to code with codable children: Secondary | ICD-10-CM | POA: Diagnosis not present

## 2013-05-08 DIAGNOSIS — M999 Biomechanical lesion, unspecified: Secondary | ICD-10-CM | POA: Diagnosis not present

## 2013-05-10 DIAGNOSIS — IMO0002 Reserved for concepts with insufficient information to code with codable children: Secondary | ICD-10-CM | POA: Diagnosis not present

## 2013-05-10 DIAGNOSIS — M999 Biomechanical lesion, unspecified: Secondary | ICD-10-CM | POA: Diagnosis not present

## 2013-05-10 DIAGNOSIS — M9981 Other biomechanical lesions of cervical region: Secondary | ICD-10-CM | POA: Diagnosis not present

## 2013-05-10 DIAGNOSIS — R51 Headache: Secondary | ICD-10-CM | POA: Diagnosis not present

## 2013-05-24 DIAGNOSIS — M9981 Other biomechanical lesions of cervical region: Secondary | ICD-10-CM | POA: Diagnosis not present

## 2013-05-24 DIAGNOSIS — IMO0002 Reserved for concepts with insufficient information to code with codable children: Secondary | ICD-10-CM | POA: Diagnosis not present

## 2013-05-24 DIAGNOSIS — M999 Biomechanical lesion, unspecified: Secondary | ICD-10-CM | POA: Diagnosis not present

## 2013-05-24 DIAGNOSIS — R51 Headache: Secondary | ICD-10-CM | POA: Diagnosis not present

## 2013-07-18 DIAGNOSIS — M999 Biomechanical lesion, unspecified: Secondary | ICD-10-CM | POA: Diagnosis not present

## 2013-07-18 DIAGNOSIS — IMO0002 Reserved for concepts with insufficient information to code with codable children: Secondary | ICD-10-CM | POA: Diagnosis not present

## 2013-07-18 DIAGNOSIS — R51 Headache: Secondary | ICD-10-CM | POA: Diagnosis not present

## 2013-07-18 DIAGNOSIS — M9981 Other biomechanical lesions of cervical region: Secondary | ICD-10-CM | POA: Diagnosis not present

## 2013-08-14 DIAGNOSIS — M9981 Other biomechanical lesions of cervical region: Secondary | ICD-10-CM | POA: Diagnosis not present

## 2013-08-14 DIAGNOSIS — R51 Headache: Secondary | ICD-10-CM | POA: Diagnosis not present

## 2013-08-14 DIAGNOSIS — M999 Biomechanical lesion, unspecified: Secondary | ICD-10-CM | POA: Diagnosis not present

## 2013-08-14 DIAGNOSIS — IMO0002 Reserved for concepts with insufficient information to code with codable children: Secondary | ICD-10-CM | POA: Diagnosis not present

## 2013-08-28 DIAGNOSIS — M999 Biomechanical lesion, unspecified: Secondary | ICD-10-CM | POA: Diagnosis not present

## 2013-08-28 DIAGNOSIS — IMO0002 Reserved for concepts with insufficient information to code with codable children: Secondary | ICD-10-CM | POA: Diagnosis not present

## 2013-08-28 DIAGNOSIS — M9981 Other biomechanical lesions of cervical region: Secondary | ICD-10-CM | POA: Diagnosis not present

## 2013-08-28 DIAGNOSIS — R51 Headache: Secondary | ICD-10-CM | POA: Diagnosis not present

## 2013-09-05 DIAGNOSIS — F325 Major depressive disorder, single episode, in full remission: Secondary | ICD-10-CM | POA: Diagnosis not present

## 2013-09-05 DIAGNOSIS — F411 Generalized anxiety disorder: Secondary | ICD-10-CM | POA: Diagnosis not present

## 2013-09-05 DIAGNOSIS — M129 Arthropathy, unspecified: Secondary | ICD-10-CM | POA: Diagnosis not present

## 2013-09-05 DIAGNOSIS — E669 Obesity, unspecified: Secondary | ICD-10-CM | POA: Diagnosis not present

## 2013-09-05 DIAGNOSIS — K2100 Gastro-esophageal reflux disease with esophagitis, without bleeding: Secondary | ICD-10-CM | POA: Diagnosis not present

## 2013-10-09 DIAGNOSIS — M999 Biomechanical lesion, unspecified: Secondary | ICD-10-CM | POA: Diagnosis not present

## 2013-10-09 DIAGNOSIS — IMO0002 Reserved for concepts with insufficient information to code with codable children: Secondary | ICD-10-CM | POA: Diagnosis not present

## 2013-10-09 DIAGNOSIS — R51 Headache: Secondary | ICD-10-CM | POA: Diagnosis not present

## 2013-10-09 DIAGNOSIS — M9981 Other biomechanical lesions of cervical region: Secondary | ICD-10-CM | POA: Diagnosis not present

## 2013-12-11 DIAGNOSIS — Z23 Encounter for immunization: Secondary | ICD-10-CM | POA: Diagnosis not present

## 2014-02-20 DIAGNOSIS — M9903 Segmental and somatic dysfunction of lumbar region: Secondary | ICD-10-CM | POA: Diagnosis not present

## 2014-02-20 DIAGNOSIS — M9905 Segmental and somatic dysfunction of pelvic region: Secondary | ICD-10-CM | POA: Diagnosis not present

## 2014-02-20 DIAGNOSIS — M5136 Other intervertebral disc degeneration, lumbar region: Secondary | ICD-10-CM | POA: Diagnosis not present

## 2014-02-20 DIAGNOSIS — M955 Acquired deformity of pelvis: Secondary | ICD-10-CM | POA: Diagnosis not present

## 2014-02-28 DIAGNOSIS — M955 Acquired deformity of pelvis: Secondary | ICD-10-CM | POA: Diagnosis not present

## 2014-02-28 DIAGNOSIS — M9903 Segmental and somatic dysfunction of lumbar region: Secondary | ICD-10-CM | POA: Diagnosis not present

## 2014-02-28 DIAGNOSIS — M5136 Other intervertebral disc degeneration, lumbar region: Secondary | ICD-10-CM | POA: Diagnosis not present

## 2014-02-28 DIAGNOSIS — M9905 Segmental and somatic dysfunction of pelvic region: Secondary | ICD-10-CM | POA: Diagnosis not present

## 2014-03-06 DIAGNOSIS — K219 Gastro-esophageal reflux disease without esophagitis: Secondary | ICD-10-CM | POA: Diagnosis not present

## 2014-03-06 DIAGNOSIS — E119 Type 2 diabetes mellitus without complications: Secondary | ICD-10-CM | POA: Diagnosis not present

## 2014-03-06 DIAGNOSIS — E039 Hypothyroidism, unspecified: Secondary | ICD-10-CM | POA: Diagnosis not present

## 2014-03-06 DIAGNOSIS — M1990 Unspecified osteoarthritis, unspecified site: Secondary | ICD-10-CM | POA: Diagnosis not present

## 2014-03-07 DIAGNOSIS — E780 Pure hypercholesterolemia: Secondary | ICD-10-CM | POA: Diagnosis not present

## 2014-03-07 DIAGNOSIS — E039 Hypothyroidism, unspecified: Secondary | ICD-10-CM | POA: Diagnosis not present

## 2014-03-07 DIAGNOSIS — I10 Essential (primary) hypertension: Secondary | ICD-10-CM | POA: Diagnosis not present

## 2014-03-07 LAB — BASIC METABOLIC PANEL
BUN: 13 mg/dL (ref 4–21)
BUN: 13 mg/dL (ref 4–21)
CREATININE: 1 mg/dL (ref 0.5–1.1)
Creatinine: 1 mg/dL (ref 0.5–1.1)
GLUCOSE: 119 mg/dL
GLUCOSE: 119 mg/dL
Potassium: 4.4 mmol/L (ref 3.4–5.3)
Potassium: 4.4 mmol/L (ref 3.4–5.3)
SODIUM: 139 mmol/L (ref 137–147)
Sodium: 139 mmol/L (ref 137–147)

## 2014-03-07 LAB — LIPID PANEL
CHOLESTEROL: 229 mg/dL — AB (ref 0–200)
CHOLESTEROL: 229 mg/dL — AB (ref 0–200)
HDL: 32 mg/dL — AB (ref 35–70)
HDL: 32 mg/dL — AB (ref 35–70)
LDL CALC: 135 mg/dL
LDL Cholesterol: 135 mg/dL
LDL/HDL RATIO: 4.2
LDl/HDL Ratio: 4.2
TRIGLYCERIDES: 310 mg/dL — AB (ref 40–160)
Triglycerides: 310 mg/dL — AB (ref 40–160)

## 2014-03-07 LAB — HEPATIC FUNCTION PANEL
ALK PHOS: 87 U/L (ref 25–125)
ALT: 12 U/L (ref 7–35)
ALT: 12 U/L (ref 7–35)
AST: 21 U/L (ref 13–35)
AST: 21 U/L (ref 13–35)
Alkaline Phosphatase: 87 U/L (ref 25–125)
Bilirubin, Total: 0.4 mg/dL

## 2014-03-07 LAB — CBC AND DIFFERENTIAL
HEMATOCRIT: 42 % (ref 36–46)
HEMATOCRIT: 42 % (ref 36–46)
HEMOGLOBIN: 14 g/dL (ref 12.0–16.0)
Hemoglobin: 14 g/dL (ref 12.0–16.0)
NEUTROS ABS: 4 /uL
Neutrophils Absolute: 4 /uL
Platelets: 395 10*3/uL (ref 150–399)
Platelets: 395 10*3/uL (ref 150–399)
WBC: 6.6 10*3/mL
WBC: 6.6 10^3/mL

## 2014-03-07 LAB — TSH
TSH: 9.65 u[IU]/mL — AB (ref 0.41–5.90)
TSH: 9.65 u[IU]/mL — AB (ref 0.41–5.90)

## 2014-06-19 DIAGNOSIS — M5136 Other intervertebral disc degeneration, lumbar region: Secondary | ICD-10-CM | POA: Diagnosis not present

## 2014-06-19 DIAGNOSIS — M9903 Segmental and somatic dysfunction of lumbar region: Secondary | ICD-10-CM | POA: Diagnosis not present

## 2014-06-19 DIAGNOSIS — M955 Acquired deformity of pelvis: Secondary | ICD-10-CM | POA: Diagnosis not present

## 2014-06-19 DIAGNOSIS — M9905 Segmental and somatic dysfunction of pelvic region: Secondary | ICD-10-CM | POA: Diagnosis not present

## 2014-07-24 DIAGNOSIS — H2512 Age-related nuclear cataract, left eye: Secondary | ICD-10-CM | POA: Diagnosis not present

## 2014-07-24 DIAGNOSIS — D3131 Benign neoplasm of right choroid: Secondary | ICD-10-CM | POA: Diagnosis not present

## 2014-07-24 DIAGNOSIS — H2511 Age-related nuclear cataract, right eye: Secondary | ICD-10-CM | POA: Diagnosis not present

## 2014-07-24 DIAGNOSIS — H18413 Arcus senilis, bilateral: Secondary | ICD-10-CM | POA: Diagnosis not present

## 2014-07-27 DIAGNOSIS — E039 Hypothyroidism, unspecified: Secondary | ICD-10-CM | POA: Insufficient documentation

## 2014-07-27 DIAGNOSIS — E7800 Pure hypercholesterolemia, unspecified: Secondary | ICD-10-CM | POA: Insufficient documentation

## 2014-07-27 DIAGNOSIS — F32A Depression, unspecified: Secondary | ICD-10-CM | POA: Insufficient documentation

## 2014-07-27 DIAGNOSIS — H9190 Unspecified hearing loss, unspecified ear: Secondary | ICD-10-CM | POA: Insufficient documentation

## 2014-07-27 DIAGNOSIS — F419 Anxiety disorder, unspecified: Secondary | ICD-10-CM | POA: Insufficient documentation

## 2014-07-27 DIAGNOSIS — K2100 Gastro-esophageal reflux disease with esophagitis, without bleeding: Secondary | ICD-10-CM | POA: Insufficient documentation

## 2014-07-27 DIAGNOSIS — I10 Essential (primary) hypertension: Secondary | ICD-10-CM | POA: Insufficient documentation

## 2014-07-27 DIAGNOSIS — E669 Obesity, unspecified: Secondary | ICD-10-CM | POA: Insufficient documentation

## 2014-07-27 DIAGNOSIS — K449 Diaphragmatic hernia without obstruction or gangrene: Secondary | ICD-10-CM | POA: Insufficient documentation

## 2014-07-27 DIAGNOSIS — F329 Major depressive disorder, single episode, unspecified: Secondary | ICD-10-CM | POA: Insufficient documentation

## 2014-07-27 DIAGNOSIS — H269 Unspecified cataract: Secondary | ICD-10-CM | POA: Insufficient documentation

## 2014-07-27 DIAGNOSIS — D649 Anemia, unspecified: Secondary | ICD-10-CM | POA: Insufficient documentation

## 2014-07-27 DIAGNOSIS — M1990 Unspecified osteoarthritis, unspecified site: Secondary | ICD-10-CM | POA: Insufficient documentation

## 2014-07-27 LAB — HM PAP SMEAR

## 2014-08-01 DIAGNOSIS — M5136 Other intervertebral disc degeneration, lumbar region: Secondary | ICD-10-CM | POA: Diagnosis not present

## 2014-08-01 DIAGNOSIS — M9903 Segmental and somatic dysfunction of lumbar region: Secondary | ICD-10-CM | POA: Diagnosis not present

## 2014-08-01 DIAGNOSIS — M9905 Segmental and somatic dysfunction of pelvic region: Secondary | ICD-10-CM | POA: Diagnosis not present

## 2014-08-01 DIAGNOSIS — M955 Acquired deformity of pelvis: Secondary | ICD-10-CM | POA: Diagnosis not present

## 2014-08-02 DIAGNOSIS — X32XXXA Exposure to sunlight, initial encounter: Secondary | ICD-10-CM | POA: Diagnosis not present

## 2014-08-02 DIAGNOSIS — L570 Actinic keratosis: Secondary | ICD-10-CM | POA: Diagnosis not present

## 2014-08-02 DIAGNOSIS — L821 Other seborrheic keratosis: Secondary | ICD-10-CM | POA: Diagnosis not present

## 2014-08-02 DIAGNOSIS — L918 Other hypertrophic disorders of the skin: Secondary | ICD-10-CM | POA: Diagnosis not present

## 2014-08-13 DIAGNOSIS — M955 Acquired deformity of pelvis: Secondary | ICD-10-CM | POA: Diagnosis not present

## 2014-08-13 DIAGNOSIS — M9903 Segmental and somatic dysfunction of lumbar region: Secondary | ICD-10-CM | POA: Diagnosis not present

## 2014-08-13 DIAGNOSIS — M9905 Segmental and somatic dysfunction of pelvic region: Secondary | ICD-10-CM | POA: Diagnosis not present

## 2014-08-13 DIAGNOSIS — M5136 Other intervertebral disc degeneration, lumbar region: Secondary | ICD-10-CM | POA: Diagnosis not present

## 2014-08-21 DIAGNOSIS — M5136 Other intervertebral disc degeneration, lumbar region: Secondary | ICD-10-CM | POA: Diagnosis not present

## 2014-08-21 DIAGNOSIS — M9905 Segmental and somatic dysfunction of pelvic region: Secondary | ICD-10-CM | POA: Diagnosis not present

## 2014-08-21 DIAGNOSIS — M955 Acquired deformity of pelvis: Secondary | ICD-10-CM | POA: Diagnosis not present

## 2014-08-21 DIAGNOSIS — M9903 Segmental and somatic dysfunction of lumbar region: Secondary | ICD-10-CM | POA: Diagnosis not present

## 2014-08-27 DIAGNOSIS — H269 Unspecified cataract: Secondary | ICD-10-CM | POA: Insufficient documentation

## 2014-08-27 DIAGNOSIS — D649 Anemia, unspecified: Secondary | ICD-10-CM | POA: Insufficient documentation

## 2014-08-27 DIAGNOSIS — H25041 Posterior subcapsular polar age-related cataract, right eye: Secondary | ICD-10-CM | POA: Diagnosis not present

## 2014-08-27 DIAGNOSIS — F419 Anxiety disorder, unspecified: Secondary | ICD-10-CM | POA: Insufficient documentation

## 2014-08-27 DIAGNOSIS — H16222 Keratoconjunctivitis sicca, not specified as Sjogren's, left eye: Secondary | ICD-10-CM | POA: Diagnosis not present

## 2014-08-27 DIAGNOSIS — H2511 Age-related nuclear cataract, right eye: Secondary | ICD-10-CM | POA: Diagnosis not present

## 2014-08-27 DIAGNOSIS — F32A Depression, unspecified: Secondary | ICD-10-CM | POA: Insufficient documentation

## 2014-08-27 DIAGNOSIS — E039 Hypothyroidism, unspecified: Secondary | ICD-10-CM | POA: Insufficient documentation

## 2014-08-27 DIAGNOSIS — H25011 Cortical age-related cataract, right eye: Secondary | ICD-10-CM | POA: Diagnosis not present

## 2014-08-27 DIAGNOSIS — G4700 Insomnia, unspecified: Secondary | ICD-10-CM | POA: Insufficient documentation

## 2014-08-27 DIAGNOSIS — H2512 Age-related nuclear cataract, left eye: Secondary | ICD-10-CM | POA: Diagnosis not present

## 2014-08-27 DIAGNOSIS — K2100 Gastro-esophageal reflux disease with esophagitis, without bleeding: Secondary | ICD-10-CM | POA: Insufficient documentation

## 2014-08-27 DIAGNOSIS — M1990 Unspecified osteoarthritis, unspecified site: Secondary | ICD-10-CM | POA: Insufficient documentation

## 2014-08-27 DIAGNOSIS — H16221 Keratoconjunctivitis sicca, not specified as Sjogren's, right eye: Secondary | ICD-10-CM | POA: Diagnosis not present

## 2014-08-27 DIAGNOSIS — E669 Obesity, unspecified: Secondary | ICD-10-CM | POA: Insufficient documentation

## 2014-08-27 DIAGNOSIS — I10 Essential (primary) hypertension: Secondary | ICD-10-CM | POA: Insufficient documentation

## 2014-08-27 DIAGNOSIS — E7800 Pure hypercholesterolemia, unspecified: Secondary | ICD-10-CM | POA: Insufficient documentation

## 2014-08-27 DIAGNOSIS — H9190 Unspecified hearing loss, unspecified ear: Secondary | ICD-10-CM | POA: Insufficient documentation

## 2014-08-27 DIAGNOSIS — K449 Diaphragmatic hernia without obstruction or gangrene: Secondary | ICD-10-CM | POA: Insufficient documentation

## 2014-08-27 DIAGNOSIS — F329 Major depressive disorder, single episode, unspecified: Secondary | ICD-10-CM | POA: Insufficient documentation

## 2014-09-11 ENCOUNTER — Encounter: Payer: Self-pay | Admitting: Family Medicine

## 2014-09-11 DIAGNOSIS — H2511 Age-related nuclear cataract, right eye: Secondary | ICD-10-CM | POA: Diagnosis not present

## 2014-09-11 DIAGNOSIS — H2513 Age-related nuclear cataract, bilateral: Secondary | ICD-10-CM | POA: Diagnosis not present

## 2014-09-12 DIAGNOSIS — H25042 Posterior subcapsular polar age-related cataract, left eye: Secondary | ICD-10-CM | POA: Diagnosis not present

## 2014-09-12 DIAGNOSIS — H2512 Age-related nuclear cataract, left eye: Secondary | ICD-10-CM | POA: Diagnosis not present

## 2014-09-12 DIAGNOSIS — H25012 Cortical age-related cataract, left eye: Secondary | ICD-10-CM | POA: Diagnosis not present

## 2014-09-19 ENCOUNTER — Encounter: Payer: Self-pay | Admitting: Family Medicine

## 2014-09-25 DIAGNOSIS — H2512 Age-related nuclear cataract, left eye: Secondary | ICD-10-CM | POA: Diagnosis not present

## 2014-10-10 DIAGNOSIS — M9903 Segmental and somatic dysfunction of lumbar region: Secondary | ICD-10-CM | POA: Diagnosis not present

## 2014-10-10 DIAGNOSIS — M9905 Segmental and somatic dysfunction of pelvic region: Secondary | ICD-10-CM | POA: Diagnosis not present

## 2014-10-10 DIAGNOSIS — M955 Acquired deformity of pelvis: Secondary | ICD-10-CM | POA: Diagnosis not present

## 2014-10-10 DIAGNOSIS — M5136 Other intervertebral disc degeneration, lumbar region: Secondary | ICD-10-CM | POA: Diagnosis not present

## 2014-10-31 DIAGNOSIS — M5136 Other intervertebral disc degeneration, lumbar region: Secondary | ICD-10-CM | POA: Diagnosis not present

## 2014-10-31 DIAGNOSIS — M955 Acquired deformity of pelvis: Secondary | ICD-10-CM | POA: Diagnosis not present

## 2014-10-31 DIAGNOSIS — M9905 Segmental and somatic dysfunction of pelvic region: Secondary | ICD-10-CM | POA: Diagnosis not present

## 2014-10-31 DIAGNOSIS — M9903 Segmental and somatic dysfunction of lumbar region: Secondary | ICD-10-CM | POA: Diagnosis not present

## 2014-11-26 DIAGNOSIS — H26491 Other secondary cataract, right eye: Secondary | ICD-10-CM | POA: Diagnosis not present

## 2014-11-27 DIAGNOSIS — M955 Acquired deformity of pelvis: Secondary | ICD-10-CM | POA: Diagnosis not present

## 2014-11-27 DIAGNOSIS — M9903 Segmental and somatic dysfunction of lumbar region: Secondary | ICD-10-CM | POA: Diagnosis not present

## 2014-11-27 DIAGNOSIS — M5136 Other intervertebral disc degeneration, lumbar region: Secondary | ICD-10-CM | POA: Diagnosis not present

## 2014-11-27 DIAGNOSIS — M9905 Segmental and somatic dysfunction of pelvic region: Secondary | ICD-10-CM | POA: Diagnosis not present

## 2014-12-10 DIAGNOSIS — H26493 Other secondary cataract, bilateral: Secondary | ICD-10-CM | POA: Diagnosis not present

## 2014-12-17 DIAGNOSIS — Z23 Encounter for immunization: Secondary | ICD-10-CM | POA: Diagnosis not present

## 2014-12-24 DIAGNOSIS — M5136 Other intervertebral disc degeneration, lumbar region: Secondary | ICD-10-CM | POA: Diagnosis not present

## 2014-12-24 DIAGNOSIS — M9905 Segmental and somatic dysfunction of pelvic region: Secondary | ICD-10-CM | POA: Diagnosis not present

## 2014-12-24 DIAGNOSIS — M955 Acquired deformity of pelvis: Secondary | ICD-10-CM | POA: Diagnosis not present

## 2014-12-24 DIAGNOSIS — M9903 Segmental and somatic dysfunction of lumbar region: Secondary | ICD-10-CM | POA: Diagnosis not present

## 2015-01-30 DIAGNOSIS — M955 Acquired deformity of pelvis: Secondary | ICD-10-CM | POA: Diagnosis not present

## 2015-01-30 DIAGNOSIS — M9905 Segmental and somatic dysfunction of pelvic region: Secondary | ICD-10-CM | POA: Diagnosis not present

## 2015-01-30 DIAGNOSIS — M9903 Segmental and somatic dysfunction of lumbar region: Secondary | ICD-10-CM | POA: Diagnosis not present

## 2015-01-30 DIAGNOSIS — M5136 Other intervertebral disc degeneration, lumbar region: Secondary | ICD-10-CM | POA: Diagnosis not present

## 2015-02-06 DIAGNOSIS — M9905 Segmental and somatic dysfunction of pelvic region: Secondary | ICD-10-CM | POA: Diagnosis not present

## 2015-02-06 DIAGNOSIS — M9903 Segmental and somatic dysfunction of lumbar region: Secondary | ICD-10-CM | POA: Diagnosis not present

## 2015-02-06 DIAGNOSIS — M955 Acquired deformity of pelvis: Secondary | ICD-10-CM | POA: Diagnosis not present

## 2015-02-06 DIAGNOSIS — M5136 Other intervertebral disc degeneration, lumbar region: Secondary | ICD-10-CM | POA: Diagnosis not present

## 2015-03-06 ENCOUNTER — Encounter: Payer: Self-pay | Admitting: Family Medicine

## 2015-03-06 ENCOUNTER — Ambulatory Visit (INDEPENDENT_AMBULATORY_CARE_PROVIDER_SITE_OTHER): Payer: Medicare Other | Admitting: Family Medicine

## 2015-03-06 VITALS — BP 108/62 | HR 78 | Temp 98.1°F | Resp 16 | Ht 64.0 in | Wt 188.0 lb

## 2015-03-06 DIAGNOSIS — Z0000 Encounter for general adult medical examination without abnormal findings: Secondary | ICD-10-CM | POA: Diagnosis not present

## 2015-03-06 MED ORDER — OMEPRAZOLE 40 MG PO CPDR: 40.0000 mg | DELAYED_RELEASE_CAPSULE | Freq: Every day | ORAL | Status: DC

## 2015-03-06 MED ORDER — LORAZEPAM 0.5 MG PO TABS: 0.5000 mg | ORAL_TABLET | Freq: Every day | ORAL | Status: DC

## 2015-03-06 MED ORDER — MIRTAZAPINE 30 MG PO TABS: 30.0000 mg | ORAL_TABLET | Freq: Every day | ORAL | Status: DC

## 2015-03-06 MED ORDER — VENLAFAXINE HCL 75 MG PO TABS: 75.0000 mg | ORAL_TABLET | Freq: Two times a day (BID) | ORAL | Status: DC

## 2015-03-06 MED ORDER — NAPROXEN 500 MG PO TABS: 500.0000 mg | ORAL_TABLET | Freq: Two times a day (BID) | ORAL | Status: DC

## 2015-03-06 MED ORDER — LEVOTHYROXINE SODIUM 175 MCG PO TABS: 175.0000 ug | ORAL_TABLET | Freq: Every day | ORAL | Status: DC

## 2015-03-06 NOTE — Progress Notes (Signed)
Patient ID: Jasmine Rich, female   DOB: 28-May-1945, 70 y.o.   MRN: [id number] Patient: Jasmine Rich, Female    DOB: 07/03/1945, 70 y.o.   MRN: [id number] Visit Date: 03/06/2015  Today's Provider: [name], MD   Chief Complaint  Patient presents with  . Annual Exam   Subjective:   Jasmine Rich is a 70 y.o. female who presents today for her Subsequent Annual Wellness Visit. She feels well. She reports no regular exercising. She reports she is sleeping fairly well.  Colonoscopy- 08/04/11 diverticulosis, internal hemorrhoids repeat 10 years; Endoscopy- 08/04/11 gastritis, mild chronic duodenitis, no repeat Pneumovax 23- 10/27/10 Tdap-10/27/10  Mammogram and pap- through Dr. Keturah Barre at [facility]  Review of Systems  Constitutional: Negative.   HENT: Negative.   Eyes: Negative.   Respiratory: Negative.   Cardiovascular: Negative.   Gastrointestinal: Negative.   Endocrine: Negative.   Genitourinary: Negative.   Musculoskeletal: Negative.   Skin: Negative.   Allergic/Immunologic: Negative.   Neurological: Negative.   Hematological: Negative.   Psychiatric/Behavioral: Negative.     Patient Active Problem List   Diagnosis Date Noted  . Acquired hypothyroidism 08/27/2014  . Absolute anemia 08/27/2014  . Anxiety 08/27/2014  . Arthritis 08/27/2014  . Cataract 08/27/2014  . Hearing loss 08/27/2014  . Clinical depression 08/27/2014  . Essential (primary) hypertension 08/27/2014  . Esophagitis, reflux 08/27/2014  . Diaphragmatic hernia 08/27/2014  . Hypercholesteremia 08/27/2014  . Cannot sleep 08/27/2014  . Adiposity 08/27/2014  . Acquired hypothyroidism 07/27/2014  . Absolute anemia 07/27/2014  . Anxiety 07/27/2014  . Arthritis 07/27/2014  . Cataract 07/27/2014  . Hearing loss 07/27/2014  . Clinical depression 07/27/2014  . Essential (primary) hypertension 07/27/2014  . Esophagitis, reflux 07/27/2014  . Diaphragmatic hernia 07/27/2014  . Hypercholesteremia 07/27/2014  .  Adiposity 07/27/2014    Social History   Social History  . Marital Status: Married    Spouse Name: N/A  . Number of Children: N/A  . Years of Education: N/A   Occupational History  . Not on file.   Social History Main Topics  . Smoking status: Not on file  . Smokeless tobacco: Not on file  . Alcohol Use: Not on file  . Drug Use: Not on file  . Sexual Activity: Not on file   Other Topics Concern  . Not on file   Social History Narrative   ** Merged History Encounter **        Past Surgical History  Procedure Laterality Date  . Thyroidectomy  10/1988  . Abdominal hysterectomy  1983  . Middle ear surgery    . Thyroidectomy    . Abdominal hysterectomy    . Hand surgery    . Foot surgery    . Inner ear surgery      Her family history includes Alcohol abuse in her brother and sister; Arthritis in her son; Brain cancer in her mother; COPD in her brother; Cancer in her mother; Diabetes in her brother and sister; Hashimoto's thyroiditis in her son; Hypertension in her father; Hypothyroidism in her son; Lung cancer in her mother; Prostate cancer in her paternal grandfather; Stroke in her father; Thyroid disease in her son.    Outpatient Prescriptions Prior to Visit  Medication Sig Dispense Refill  . bisoprolol-hydrochlorothiazide (ZIAC) 5-6.25 MG per tablet Take by mouth.    . ferrous sulfate 325 (65 FE) MG tablet Take by mouth.    . Glucosamine-Chondroitin (GLUCOSAMINE CHONDR COMPLEX) 500-400 MG CAPS Take by mouth.    Marland Kitchen  levothyroxine (SYNTHROID, LEVOTHROID) 175 MCG tablet Take by mouth.    Marland Kitchen LORazepam (ATIVAN) 0.5 MG tablet Take by mouth.    . mirtazapine (REMERON) 30 MG tablet Take by mouth.    . naproxen (NAPROSYN) 500 MG tablet Take by mouth.    Marland Kitchen omeprazole (PRILOSEC) 40 MG capsule Take by mouth.    . sucralfate (CARAFATE) 1 G tablet Take by mouth.    . venlafaxine (EFFEXOR) 75 MG tablet Take by mouth.    . bisoprolol-hydrochlorothiazide (ZIAC) 5-6.25 MG per tablet  Take by mouth.    . ferrous sulfate (IRON SUPPLEMENT) 325 (65 FE) MG tablet Take by mouth.    . Glucosamine-Chondroitin 500-400 MG CAPS Take by mouth.    . levothyroxine (SYNTHROID, LEVOTHROID) 175 MCG tablet Take by mouth.    Marland Kitchen LORazepam (ATIVAN) 0.5 MG tablet Take by mouth.    . mirtazapine (REMERON) 30 MG tablet Take by mouth.    . naproxen (NAPROSYN) 500 MG tablet Take by mouth.    Marland Kitchen omeprazole (PRILOSEC) 40 MG capsule Take by mouth.    . sucralfate (CARAFATE) 1 G tablet Take by mouth.    . venlafaxine (EFFEXOR) 75 MG tablet Take by mouth.     No facility-administered medications prior to visit.    Allergies  Allergen Reactions  . Codeine Nausea Only and Other (See Comments)  . Codeine Other (See Comments)    Abdominal pain and nausea    Patient Care Team: Jerrol Banana., MD as PCP - General (Family Medicine)  Objective:   Vitals: There were no vitals filed for this visit.  Physical Exam  Constitutional: She is oriented to person, place, and time. She appears well-developed and well-nourished.  HENT:  Head: Normocephalic and atraumatic.  Right Ear: External ear normal.  Left Ear: External ear normal.  Nose: Nose normal.  Mouth/Throat: Oropharynx is clear and moist.  Eyes: Conjunctivae and EOM are normal. Pupils are equal, round, and reactive to light.  Neck: Normal range of motion. Neck supple.  Cardiovascular: Normal rate, regular rhythm, normal heart sounds and intact distal pulses.   Pulmonary/Chest: Effort normal and breath sounds normal.  Abdominal: Soft. Bowel sounds are normal.  Musculoskeletal: Normal range of motion.  Neurological: She is alert and oriented to person, place, and time. She has normal reflexes.  Skin: Skin is warm and dry.  Psychiatric: She has a normal mood and affect. Her behavior is normal. Judgment and thought content normal.    Activities of Daily Living In your present state of health, do you have any difficulty performing the  following activities: 03/06/2015  Hearing? N  Vision? N  Difficulty concentrating or making decisions? N  Walking or climbing stairs? N  Dressing or bathing? N  Doing errands, shopping? N    Fall Risk Assessment Fall Risk  03/06/2015  Falls in the past year? No     Depression Screen PHQ 2/9 Scores 03/06/2015  PHQ - 2 Score 0    Cognitive Testing - 6-CIT    Year: 0 points  Month: 0 points  Memorize "Pia Mau, [address], [place]"  Time (within 1 hour:) 0 points  Count backwards from 20: 0 points  Name months of year: 2 points  Repeat Address: 4 points   Total Score: 6/28  Interpretation : Normal (0-7) Abnormal (8-28)    Assessment & Plan:     Annual Wellness Visit  Reviewed patient's Family Medical History Reviewed and updated list of patient's medical providers Assessment  of cognitive impairment was done Assessed patient's functional ability Established a written schedule for health screening [place] Completed and Reviewed  Exercise Activities and Dietary recommendations Goals    None      Immunization History  Administered Date(s) Administered  . Influenza-Unspecified 11/30/2012, 12/17/2014  . Pneumococcal Polysaccharide-23 10/27/2010, 10/27/2010  . Tdap 10/27/2010, 10/27/2010    Health Maintenance  Topic Date Due  . Hepatitis C Screening  [birth year]  . ZOSTAVAX  06/22/2005  . DEXA SCAN  06/23/2010  . PNA vac Low Risk Adult (2 of 2 - PCV13) 10/27/2011  . MAMMOGRAM  06/19/2012  . INFLUENZA VACCINE  10/01/2015  . TETANUS/TDAP  10/26/2020  . COLONOSCOPY  08/03/2021      Discussed health benefits of physical activity, and encouraged her to engage in regular exercise appropriate for her age and condition.  S/p bilateral cataract surgery July 2016. Patient was seen and examined by Dr. Miguel Aschoff, and noted scribed by Webb Laws, [place] MD [facility] 03/06/2015 9:31 AM  ------------------------------------------------------------------------------------------------------------

## 2015-03-25 ENCOUNTER — Other Ambulatory Visit: Payer: Self-pay | Admitting: Family Medicine

## 2015-04-04 ENCOUNTER — Encounter: Payer: Self-pay | Admitting: Obstetrics and Gynecology

## 2015-04-04 ENCOUNTER — Ambulatory Visit (INDEPENDENT_AMBULATORY_CARE_PROVIDER_SITE_OTHER): Payer: Medicare Other | Admitting: Obstetrics and Gynecology

## 2015-04-04 VITALS — BP 114/84 | HR 116 | Ht 64.0 in | Wt 186.6 lb

## 2015-04-04 DIAGNOSIS — R638 Other symptoms and signs concerning food and fluid intake: Secondary | ICD-10-CM | POA: Insufficient documentation

## 2015-04-04 DIAGNOSIS — N951 Menopausal and female climacteric states: Secondary | ICD-10-CM

## 2015-04-04 DIAGNOSIS — Z01419 Encounter for gynecological examination (general) (routine) without abnormal findings: Secondary | ICD-10-CM

## 2015-04-04 DIAGNOSIS — Z1239 Encounter for other screening for malignant neoplasm of breast: Secondary | ICD-10-CM | POA: Diagnosis not present

## 2015-04-04 DIAGNOSIS — Z0000 Encounter for general adult medical examination without abnormal findings: Secondary | ICD-10-CM | POA: Diagnosis not present

## 2015-04-04 DIAGNOSIS — Z124 Encounter for screening for malignant neoplasm of cervix: Secondary | ICD-10-CM | POA: Diagnosis not present

## 2015-04-04 DIAGNOSIS — Z1211 Encounter for screening for malignant neoplasm of colon: Secondary | ICD-10-CM | POA: Diagnosis not present

## 2015-04-04 MED ORDER — CLONIDINE HCL 0.1 MG/24HR TD PTWK: 0.1000 mg | MEDICATED_PATCH | TRANSDERMAL | Status: DC

## 2015-04-04 NOTE — Progress Notes (Signed)
Patient ID: Jasmine Rich, female   DOB: 12/25/1945, 70 y.o.   MRN: [id number] ANNUAL PREVENTATIVE CARE GYN  ENCOUNTER NOTE  Subjective:       Jasmine Rich is a 70 y.o. No obstetric history on file. female here for a routine annual gynecologic exam.  Current complaints: 1.  none   70 yo G2P2002 female presents to the office today for an annual exam. The patient has been doing well since her last visit, although she continues to have hot flashes. She notes that the hot flashes are exacerbated by stress and drying her hair. She typically manages them by placing a cool towel on her neck or using fans, etc. The patient has vaginal dryness for which she has tried Estring but she discontinued it due to expense. She denies urinary incontinence, although she occasionally has minimal incontinence if she holds her urine for too long. She denies changes in BM. Since last year she has been under more stress due to her husband's recent diagnosis of PTSD. Her anxiety is well managed with medications. She is not sexually active due to her husband's condition, but this is not an issue for her. Patient is due for a mammography.    Gynecologic History No LMP recorded. Patient has had a hysterectomy. Contraception: status post hysterectomy Last Pap: no further paps. Results were: n/a Last mammogram: 2015 birad 1. Results were: normal  Obstetric History OB History  Gravida Para Term Preterm AB SAB TAB Ectopic Multiple Living  0 0 0 0 0 0 0 0          Past Medical History  Diagnosis Date  . Anxiety   . Depression   . GERD (gastroesophageal reflux disease)   . Thyroid disease   . Hypertension   . Anemia     Past Surgical History  Procedure Laterality Date  . Thyroidectomy  10/1988  . Abdominal hysterectomy  1983  . Middle ear surgery    . Thyroidectomy    . Abdominal hysterectomy    . Hand surgery    . Foot surgery    . Inner ear surgery    . Cataract removed      Current Outpatient  Prescriptions on File Prior to Visit  Medication Sig Dispense Refill  . bisoprolol-hydrochlorothiazide (ZIAC) 5-6.25 MG tablet TAKE ONE TABLET BY MOUTH ONCE DAILY 90 tablet 0  . ferrous sulfate 325 (65 FE) MG tablet Take by mouth. Reported on 03/06/2015    . levothyroxine (SYNTHROID, LEVOTHROID) 175 MCG tablet Take 1 tablet (175 mcg total) by mouth daily before breakfast. 30 tablet 12  . LORazepam (ATIVAN) 0.5 MG tablet Take 1 tablet (0.5 mg total) by mouth at bedtime. 30 tablet 5  . mirtazapine (REMERON) 30 MG tablet Take 1 tablet (30 mg total) by mouth at bedtime. 30 tablet 12  . naproxen (NAPROSYN) 500 MG tablet Take 1 tablet (500 mg total) by mouth 2 (two) times daily with a meal. 60 tablet 5  . omeprazole (PRILOSEC) 40 MG capsule Take 1 capsule (40 mg total) by mouth daily. 30 capsule 12  . venlafaxine (EFFEXOR) 75 MG tablet Take 1 tablet (75 mg total) by mouth 2 (two) times daily. 60 tablet 12   No current facility-administered medications on file prior to visit.    Allergies  Allergen Reactions  . Codeine Other (See Comments)    Abdominal pain and nausea    Social History   Social History  . Marital Status: Married  Spouse Name: N/A  . Number of Children: N/A  . Years of Education: N/A   Occupational History  . Not on file.   Social History Main Topics  . Smoking status: Former Smoker    Quit date: 03/03/1983  . Smokeless tobacco: Not on file     Comment: quit smoking in 1985  . Alcohol Use: Yes     Comment: 2-3 drinks per week  . Drug Use: No  . Sexual Activity: Not Currently    Birth Control/ Protection: Surgical   Other Topics Concern  . Not on file   Social History Narrative   ** Merged History Encounter **        Family History  Problem Relation Age of Onset  . Cancer Mother     Lung and brain cancer. Died from Non- Hodgkin's Lymphoma  . Hypothyroidism Son   . Lung cancer Mother   . Brain cancer Mother   . Stroke Father   . Hypertension Father    . Alcohol abuse Sister   . Diabetes Sister   . COPD Brother   . Thyroid disease Son   . Prostate cancer Paternal Grandfather   . Arthritis Son   . Hashimoto's thyroiditis Son   . Alcohol abuse Brother   . Diabetes Brother     The following portions of the patient's history were reviewed and updated as appropriate: allergies, current medications, past family history, past medical history, past social history, past surgical history and problem list.  Review of Systems ROS Review of Systems - General ROS: positive for- hot flashes; negative for - chills, fatigue, fever, night sweats Psychological ROS: positive for- anxiety, stress Ophthalmic ROS: positive for - blurry vision (recent cataract surgery)  Respiratory ROS: negative for - cough or shortness of breath Cardiovascular ROS: positive for- shortness of breath (stress related); negative for - chest pain, dyspnea on exertion Gastrointestinal ROS: no change in bowel habits, or black or bloody stools Genito-Urinary ROS: no dysuria, trouble voiding, or hematuria    Objective:   BP 114/84 mmHg  Pulse 116  Ht 5\' 4"  (1.626 m)  Wt 186 lb 9.6 oz (84.641 kg)  BMI 32.01 kg/m2 CONSTITUTIONAL: Well-developed, well-nourished female in no acute distress.  PSYCHIATRIC: Normal mood and affect. Normal behavior. Normal judgment and thought content. [place]: Alert and oriented to person, place, and time. HENT:  Normocephalic, atraumatic  NECK: Normal range of motion, supple, no masses.  Normal thyroid.  SKIN: Skin is warm and dry. No rash noted. Not diaphoretic. No erythema. No pallor. CARDIOVASCULAR: Normal heart rate noted, regular rhythm, no murmur. RESPIRATORY: Clear to auscultation bilaterally. Effort and breath sounds normal, no problems with respiration noted. BREASTS: Symmetric in size. No masses, skin changes, nipple drainage, or lymphadenopathy. ABDOMEN: Soft, normal bowel sounds, no distention noted.  No tenderness, rebound or  guarding.  BLADDER: Normal PELVIC:  External Genitalia: Normal  BUS: Normal  Vagina: fair estrogen effect; good vault support  Cervix: surgically absent  Uterus: surgically absent  Adnexa: Normal  RV: External Exam NormaI, No Rectal Masses and Normal Sphincter tone     Assessment:   Annual gynecologic examination 70 y.o. Contraception: status post hysterectomy [id number] Menopausal state, minimally symptomatic  Plan:  Pap: Not needed Mammogram: Ordered Stool Guaiac Testing:  Ordered Labs: thur pcp Routine preventative health maintenance measures emphasized: Exercise/Diet/Weight control, Tobacco Warnings, Alcohol/Substance use risks and Stress Management  1. Begin clonidine 0.1mg  patch biweekly 2. Return to Clinic 6 weeks; consider estradiol patch if the  clonidine patch does not work   Event organiser, CMA  Loni Muse PA-S  Brayton Mars, MD   I have seen, interviewed, and examined the patient in conjunction with the [place].A. student and affirm the diagnosis and management plan. Jurgen Groeneveld A. Monick Rena, MD, FACOG   Note: This dictation was prepared with Dragon dictation along with smaller phrase technology. Any transcriptional errors that result from this process are unintentional.

## 2015-04-15 DIAGNOSIS — H04121 Dry eye syndrome of right lacrimal gland: Secondary | ICD-10-CM | POA: Diagnosis not present

## 2015-04-15 DIAGNOSIS — H04123 Dry eye syndrome of bilateral lacrimal glands: Secondary | ICD-10-CM | POA: Diagnosis not present

## 2015-04-15 DIAGNOSIS — H04122 Dry eye syndrome of left lacrimal gland: Secondary | ICD-10-CM | POA: Diagnosis not present

## 2015-04-17 ENCOUNTER — Ambulatory Visit
Admission: RE | Admit: 2015-04-17 | Discharge: 2015-04-17 | Disposition: A | Discharge status: Home / Self Care | Payer: Medicare Other | Source: Ambulatory Visit | Attending: Obstetrics and Gynecology | Admitting: Obstetrics and Gynecology

## 2015-04-17 ENCOUNTER — Other Ambulatory Visit: Payer: Self-pay | Admitting: Obstetrics and Gynecology

## 2015-04-17 DIAGNOSIS — Z1231 Encounter for screening mammogram for malignant neoplasm of breast: Secondary | ICD-10-CM

## 2015-04-17 DIAGNOSIS — Z1239 Encounter for other screening for malignant neoplasm of breast: Secondary | ICD-10-CM

## 2015-04-19 ENCOUNTER — Other Ambulatory Visit: Payer: Self-pay | Admitting: Obstetrics and Gynecology

## 2015-04-19 DIAGNOSIS — R928 Other abnormal and inconclusive findings on diagnostic imaging of breast: Secondary | ICD-10-CM

## 2015-04-30 ENCOUNTER — Ambulatory Visit
Admission: RE | Admit: 2015-04-30 | Discharge: 2015-04-30 | Disposition: A | Discharge status: Home / Self Care | Payer: Medicare Other | Source: Ambulatory Visit | Attending: Obstetrics and Gynecology | Admitting: Obstetrics and Gynecology

## 2015-04-30 DIAGNOSIS — R928 Other abnormal and inconclusive findings on diagnostic imaging of breast: Secondary | ICD-10-CM

## 2015-05-16 ENCOUNTER — Ambulatory Visit: Payer: Self-pay | Admitting: Obstetrics and Gynecology

## 2015-06-17 DIAGNOSIS — H04123 Dry eye syndrome of bilateral lacrimal glands: Secondary | ICD-10-CM | POA: Diagnosis not present

## 2015-07-01 DIAGNOSIS — J069 Acute upper respiratory infection, unspecified: Secondary | ICD-10-CM | POA: Diagnosis not present

## 2015-07-15 ENCOUNTER — Ambulatory Visit (INDEPENDENT_AMBULATORY_CARE_PROVIDER_SITE_OTHER): Payer: Medicare Other | Admitting: Family Medicine

## 2015-07-15 VITALS — BP 132/64 | HR 112 | Temp 97.8°F | Resp 18 | Wt 188.0 lb

## 2015-07-15 DIAGNOSIS — E7800 Pure hypercholesterolemia, unspecified: Secondary | ICD-10-CM

## 2015-07-15 DIAGNOSIS — E039 Hypothyroidism, unspecified: Secondary | ICD-10-CM | POA: Diagnosis not present

## 2015-07-15 DIAGNOSIS — R062 Wheezing: Secondary | ICD-10-CM

## 2015-07-15 DIAGNOSIS — I10 Essential (primary) hypertension: Secondary | ICD-10-CM

## 2015-07-15 DIAGNOSIS — J329 Chronic sinusitis, unspecified: Secondary | ICD-10-CM

## 2015-07-15 MED ORDER — FLUTICASONE-SALMETEROL 100-50 MCG/DOSE IN AEPB: 1.0000 | INHALATION_SPRAY | Freq: Two times a day (BID) | RESPIRATORY_TRACT | Status: DC

## 2015-07-15 MED ORDER — ALBUTEROL SULFATE HFA 108 (90 BASE) MCG/ACT IN AERS: 2.0000 | INHALATION_SPRAY | Freq: Four times a day (QID) | RESPIRATORY_TRACT | Status: DC | PRN

## 2015-07-15 MED ORDER — DOXYCYCLINE HYCLATE 100 MG PO TABS: 100.0000 mg | ORAL_TABLET | Freq: Two times a day (BID) | ORAL | Status: DC

## 2015-07-15 MED ORDER — BISOPROLOL-HYDROCHLOROTHIAZIDE 5-6.25 MG PO TABS
1.0000 | ORAL_TABLET | Freq: Every day | ORAL | Status: DC
Start: 2015-07-15 — End: 2016-02-17

## 2015-07-15 NOTE — Patient Instructions (Signed)
Patient is advised to use Netti Pot irrigation and then her Nasonex.

## 2015-07-15 NOTE — Progress Notes (Signed)
Patient ID: Jasmine Rich, female   DOB: [birth year], 70 y.o.   MRN: [id number]   Jasmine Rich  MRN: [id number] DOB: 08-09-1945  Subjective:  HPI   1. Essential (primary) hypertension The patient is a 70 year old female who presents for follow up of her hypertension.  She states does not check her pressure at home.  She has been out of her medicine for about 1 month and needs to have it refilled.  2. Acquired hypothyroidism The patient is also here to have follow upon her thyroid.  She states that since being placed on the increased dose of Levothyroxine she is feeling much better.  3. Hypercholesteremia It is also time for her to have her lipids checked.  The patient is recovering from bronchitis.  She states she is still having some trouble with being short of breath when she is active.  She states that she is still coughing but unable to cough up anything.    Patient Active Problem List   Diagnosis Date Noted  . Perimenopausal vasomotor symptoms 04/04/2015  . Increased BMI 04/04/2015  . Acquired hypothyroidism 08/27/2014  . Absolute anemia 08/27/2014  . Anxiety 08/27/2014  . Arthritis 08/27/2014  . Cataract 08/27/2014  . Hearing loss 08/27/2014  . Clinical depression 08/27/2014  . Essential (primary) hypertension 08/27/2014  . Esophagitis, reflux 08/27/2014  . Diaphragmatic hernia 08/27/2014  . Hypercholesteremia 08/27/2014  . Cannot sleep 08/27/2014  . Adiposity 08/27/2014  . Acquired hypothyroidism 07/27/2014  . Absolute anemia 07/27/2014  . Anxiety 07/27/2014  . Arthritis 07/27/2014  . Cataract 07/27/2014  . Hearing loss 07/27/2014  . Clinical depression 07/27/2014  . Essential (primary) hypertension 07/27/2014  . Esophagitis, reflux 07/27/2014  . Diaphragmatic hernia 07/27/2014  . Hypercholesteremia 07/27/2014  . Adiposity 07/27/2014    Past Medical History  Diagnosis Date  . Anxiety   . Depression   . GERD (gastroesophageal reflux disease)   . Thyroid  disease   . Hypertension   . Anemia     Social History   Social History  . Marital Status: Married    Spouse Name: N/A  . Number of Children: N/A  . Years of Education: N/A   Occupational History  . Not on file.   Social History Main Topics  . Smoking status: Former Smoker    Quit date: 03/03/1983  . Smokeless tobacco: Not on file     Comment: quit smoking in 1985  . Alcohol Use: Yes     Comment: 2-3 drinks per week  . Drug Use: No  . Sexual Activity: Not Currently    Birth Control/ Protection: Surgical   Other Topics Concern  . Not on file   Social History Narrative   ** Merged History Encounter **        Outpatient Prescriptions Prior to Visit  Medication Sig Dispense Refill  . bisoprolol-hydrochlorothiazide (ZIAC) 5-6.25 MG tablet TAKE ONE TABLET BY MOUTH ONCE DAILY 90 tablet 0  . cloNIDine (CATAPRES - DOSED IN MG/24 HR) 0.1 mg/24hr patch Place 1 patch (0.1 mg total) onto the skin once a week. 4 patch 12  . ferrous sulfate 325 (65 FE) MG tablet Take by mouth. Reported on 03/06/2015    . levothyroxine (SYNTHROID, LEVOTHROID) 175 MCG tablet Take 1 tablet (175 mcg total) by mouth daily before breakfast. 30 tablet 12  . LORazepam (ATIVAN) 0.5 MG tablet Take 1 tablet (0.5 mg total) by mouth at bedtime. 30 tablet 5  . mirtazapine (REMERON) 30 MG tablet  Take 1 tablet (30 mg total) by mouth at bedtime. 30 tablet 12  . naproxen (NAPROSYN) 500 MG tablet Take 1 tablet (500 mg total) by mouth 2 (two) times daily with a meal. 60 tablet 5  . omeprazole (PRILOSEC) 40 MG capsule Take 1 capsule (40 mg total) by mouth daily. 30 capsule 12  . sucralfate (CARAFATE) 1 g tablet Take 1 g by mouth 4 (four) times daily -  with meals and at bedtime.    Marland Kitchen venlafaxine (EFFEXOR) 75 MG tablet Take 1 tablet (75 mg total) by mouth 2 (two) times daily. 60 tablet 12   No facility-administered medications prior to visit.    Allergies  Allergen Reactions  . Codeine Other (See Comments)     Abdominal pain and nausea    Review of Systems  Constitutional: Positive for malaise/fatigue. Negative for fever.  HENT: Positive for hearing loss (chronic deafness in right ear.) and tinnitus (Chronic from her deafness). Negative for ear discharge, ear pain, nosebleeds and sore throat. Congestion: nasal congestion.   Respiratory: Positive for cough, shortness of breath and wheezing. Negative for hemoptysis and sputum production.   Cardiovascular: Negative for chest pain, palpitations, orthopnea, claudication and leg swelling.  Neurological: Positive for weakness and headaches.   Objective:  BP 132/64 mmHg  Pulse 112  Temp(Src) 97.8 F (36.6 C) (Oral)  Resp 18  Wt 188 lb (85.276 kg)  Physical Exam  Constitutional: She is oriented to person, place, and time and well-developed, well-nourished, and in no distress.  HENT:  Head: Normocephalic and atraumatic.  Right Ear: External ear normal.  Left Ear: External ear normal.  Nose: Nose normal.  Mouth/Throat: Oropharynx is clear and moist.  Maxillary sinus tenderness   Eyes: Conjunctivae and EOM are normal. Pupils are equal, round, and reactive to light.  Neck: Normal range of motion.  Cardiovascular: Normal rate, regular rhythm and normal heart sounds.   Pulmonary/Chest: Effort normal. She has wheezes (MIld end expiratory ).  Abdominal: Soft. Bowel sounds are normal.  Neurological: She is alert and oriented to person, place, and time. Gait normal.  Skin: Skin is warm and dry.  Psychiatric: Mood, memory, affect and judgment normal.    Assessment and Plan :  1. Essential (primary) hypertension  - CBC with Differential/Platelet - Comprehensive metabolic panel - bisoprolol-hydrochlorothiazide (ZIAC) 5-6.25 MG tablet; Take 1 tablet by mouth daily.  Dispense: 90 tablet; Refill: 3  2. Acquired hypothyroidism  - TSH  3. Hypercholesteremia  - Lipid Panel With LDL/HDL Ratio  4. Chronic sinusitis, unspecified location  -  doxycycline (VIBRA-TABS) 100 MG tablet; Take 1 tablet (100 mg total) by mouth 2 (two) times daily.  Dispense: 20 tablet; Refill: 0  5. Wheeze/ mild asthmatic bronchitis.  I think should this should clear with sinus infection clearing  - albuterol (PROVENTIL HFA;VENTOLIN HFA) 108 (90 Base) MCG/ACT inhaler; Inhale 2 puffs into the lungs every 6 (six) hours as needed for wheezing or shortness of breath.  Dispense: 1 Inhaler; Refill: 0 - Fluticasone-Salmeterol (ADVAIR) 100-50 MCG/DOSE AEPB; Inhale 1 puff into the lungs 2 (two) times daily.  Dispense: 1 each; Refill: 3   I have done the exam and reviewed the above chart and it is accurate to the best of my knowledge.  Miguel Aschoff MD [facility] 07/15/2015 9:39 AM

## 2015-07-16 DIAGNOSIS — I10 Essential (primary) hypertension: Secondary | ICD-10-CM | POA: Diagnosis not present

## 2015-07-16 DIAGNOSIS — E039 Hypothyroidism, unspecified: Secondary | ICD-10-CM | POA: Diagnosis not present

## 2015-07-16 DIAGNOSIS — E7800 Pure hypercholesterolemia, unspecified: Secondary | ICD-10-CM | POA: Diagnosis not present

## 2015-07-17 LAB — CBC WITH DIFFERENTIAL/PLATELET
Basophils Absolute: 0 10*3/uL (ref 0.0–0.2)
Basos: 0 %
EOS (ABSOLUTE): 0 10*3/uL (ref 0.0–0.4)
EOS: 0 %
Hematocrit: 36.8 % (ref 34.0–46.6)
Hemoglobin: 11.7 g/dL (ref 11.1–15.9)
IMMATURE GRANULOCYTES: 0 %
Immature Grans (Abs): 0 10*3/uL (ref 0.0–0.1)
LYMPHS ABS: 2.1 10*3/uL (ref 0.7–3.1)
Lymphs: 31 %
MCH: 27.4 pg (ref 26.6–33.0)
MCHC: 31.8 g/dL (ref 31.5–35.7)
MCV: 86 fL (ref 79–97)
MONOCYTES: 12 %
MONOS ABS: 0.8 10*3/uL (ref 0.1–0.9)
NEUTROS ABS: 3.7 10*3/uL (ref 1.4–7.0)
Neutrophils: 57 %
PLATELETS: 430 10*3/uL — AB (ref 150–379)
RBC: 4.27 x10E6/uL (ref 3.77–5.28)
RDW: 15.4 % (ref 12.3–15.4)
WBC: 6.5 10*3/uL (ref 3.4–10.8)

## 2015-07-17 LAB — COMPREHENSIVE METABOLIC PANEL
A/G RATIO: 2.1 (ref 1.2–2.2)
ALK PHOS: 102 IU/L (ref 39–117)
ALT: 12 IU/L (ref 0–32)
AST: 23 IU/L (ref 0–40)
Albumin: 4.1 g/dL (ref 3.5–4.8)
BUN/Creatinine Ratio: 14 (ref 12–28)
BUN: 12 mg/dL (ref 8–27)
Bilirubin Total: 0.3 mg/dL (ref 0.0–1.2)
CALCIUM: 9.4 mg/dL (ref 8.7–10.3)
CO2: 24 mmol/L (ref 18–29)
CREATININE: 0.85 mg/dL (ref 0.57–1.00)
Chloride: 102 mmol/L (ref 96–106)
GFR calc Af Amer: 80 mL/min/{1.73_m2} (ref >59)
GFR, EST NON AFRICAN AMERICAN: 70 mL/min/{1.73_m2} (ref >59)
Globulin, Total: 2 g/dL (ref 1.5–4.5)
Glucose: 108 mg/dL — ABNORMAL HIGH (ref 65–99)
Potassium: 4.9 mmol/L (ref 3.5–5.2)
SODIUM: 141 mmol/L (ref 134–144)
Total Protein: 6.1 g/dL (ref 6.0–8.5)

## 2015-07-17 LAB — LIPID PANEL WITH LDL/HDL RATIO
CHOLESTEROL TOTAL: 199 mg/dL (ref 100–199)
HDL: 30 mg/dL — AB (ref >39)
LDL Calculated: 123 mg/dL — ABNORMAL HIGH (ref 0–99)
LDL/HDL RATIO: 4.1 ratio — AB (ref 0.0–3.2)
TRIGLYCERIDES: 232 mg/dL — AB (ref 0–149)
VLDL Cholesterol Cal: 46 mg/dL — ABNORMAL HIGH (ref 5–40)

## 2015-07-17 LAB — TSH: TSH: 0.065 u[IU]/mL — ABNORMAL LOW (ref 0.450–4.500)

## 2015-07-18 ENCOUNTER — Telehealth: Payer: Self-pay

## 2015-07-18 NOTE — Telephone Encounter (Signed)
Advised pt of lab results. Pt verbally acknowledges understanding. Latayvia Mandujano Drozdowski, CMA   

## 2015-07-18 NOTE — Telephone Encounter (Signed)
-----   Message from Margarita Rana, MD sent at 07/18/2015  6:37 AM EDT ----- Labs stable except thyroid on high end. Know patient feels better on higher dose so would recommend recheck complete thyroid panel in 3 months.  Thanks.

## 2015-08-31 HISTORY — PX: CATARACT EXTRACTION, BILATERAL: SHX1313

## 2015-11-01 HISTORY — PX: LASIK: SHX215

## 2015-11-13 DIAGNOSIS — M955 Acquired deformity of pelvis: Secondary | ICD-10-CM | POA: Diagnosis not present

## 2015-11-13 DIAGNOSIS — M9903 Segmental and somatic dysfunction of lumbar region: Secondary | ICD-10-CM | POA: Diagnosis not present

## 2015-11-13 DIAGNOSIS — M5136 Other intervertebral disc degeneration, lumbar region: Secondary | ICD-10-CM | POA: Diagnosis not present

## 2015-11-13 DIAGNOSIS — M9905 Segmental and somatic dysfunction of pelvic region: Secondary | ICD-10-CM | POA: Diagnosis not present

## 2015-11-19 DIAGNOSIS — M9903 Segmental and somatic dysfunction of lumbar region: Secondary | ICD-10-CM | POA: Diagnosis not present

## 2015-11-19 DIAGNOSIS — M955 Acquired deformity of pelvis: Secondary | ICD-10-CM | POA: Diagnosis not present

## 2015-11-19 DIAGNOSIS — M5136 Other intervertebral disc degeneration, lumbar region: Secondary | ICD-10-CM | POA: Diagnosis not present

## 2015-11-19 DIAGNOSIS — M9905 Segmental and somatic dysfunction of pelvic region: Secondary | ICD-10-CM | POA: Diagnosis not present

## 2015-12-13 DIAGNOSIS — Z23 Encounter for immunization: Secondary | ICD-10-CM | POA: Diagnosis not present

## 2015-12-17 DIAGNOSIS — M955 Acquired deformity of pelvis: Secondary | ICD-10-CM | POA: Diagnosis not present

## 2015-12-17 DIAGNOSIS — M9905 Segmental and somatic dysfunction of pelvic region: Secondary | ICD-10-CM | POA: Diagnosis not present

## 2015-12-17 DIAGNOSIS — M9903 Segmental and somatic dysfunction of lumbar region: Secondary | ICD-10-CM | POA: Diagnosis not present

## 2015-12-17 DIAGNOSIS — M5136 Other intervertebral disc degeneration, lumbar region: Secondary | ICD-10-CM | POA: Diagnosis not present

## 2016-01-13 ENCOUNTER — Ambulatory Visit (INDEPENDENT_AMBULATORY_CARE_PROVIDER_SITE_OTHER): Payer: Medicare Other | Admitting: Family Medicine

## 2016-01-13 ENCOUNTER — Encounter: Payer: Self-pay | Admitting: Family Medicine

## 2016-01-13 VITALS — BP 118/68 | HR 72 | Temp 98.9°F | Resp 16 | Ht 64.0 in | Wt 190.0 lb

## 2016-01-13 DIAGNOSIS — M1990 Unspecified osteoarthritis, unspecified site: Secondary | ICD-10-CM | POA: Diagnosis not present

## 2016-01-13 DIAGNOSIS — R490 Dysphonia: Secondary | ICD-10-CM | POA: Diagnosis not present

## 2016-01-13 DIAGNOSIS — I10 Essential (primary) hypertension: Secondary | ICD-10-CM | POA: Diagnosis not present

## 2016-01-13 DIAGNOSIS — E7800 Pure hypercholesterolemia, unspecified: Secondary | ICD-10-CM

## 2016-01-13 DIAGNOSIS — E039 Hypothyroidism, unspecified: Secondary | ICD-10-CM | POA: Diagnosis not present

## 2016-01-13 MED ORDER — NAPROXEN 500 MG PO TABS: 500.0000 mg | ORAL_TABLET | Freq: Two times a day (BID) | ORAL | 11 refills | Status: DC

## 2016-01-13 NOTE — Progress Notes (Signed)
Patient: Jasmine Rich Female    DOB: 14-Mar-1945   70 y.o.   MRN: [id number] Visit Date: 01/13/2016  Today's Provider: [name], MD   Chief Complaint  Patient presents with  . Hypertension  . Hypothyroidism   Subjective:    HPI  Hypertension, follow-up:  BP Readings from Last 3 Encounters:  01/13/16 118/68  07/15/15 132/64  04/04/15 114/84    She was last seen for hypertension 6 months ago.  BP at that visit was 132/64. Management since that visit includes no changes. She reports good compliance with treatment. She is not having side effects.  She is not exercising. She is adherent to low salt diet.   Outside blood pressures are not being checked. Patient denies chest pain, lower extremity edema, paroxysmal nocturnal dyspnea and tachypnea.   Cardiovascular risk factors include dyslipidemia.     Weight trend: stable Wt Readings from Last 3 Encounters:  01/13/16 190 lb (86.2 kg)  07/15/15 188 lb (85.3 kg)  04/04/15 186 lb 9.6 oz (84.6 kg)    Current diet: well balanced    Lipid/Cholesterol, Follow-up:   Last seen for this6 months ago.  Management changes since that visit include no changes. . Last Lipid Panel:    Component Value Date/Time   CHOL 199 07/16/2015 0839   TRIG 232 (H) 07/16/2015 0839   HDL 30 (L) 07/16/2015 0839   LDLCALC 123 (H) 07/16/2015 [id number]    Risk factors for vascular disease include hypertension  She reports good compliance with treatment. She is not having side effects.  Current symptoms include none and have been stable. Weight trend: stable Prior visit with dietician: no Current diet: well balanced Current exercise: none  Wt Readings from Last 3 Encounters:  01/13/16 190 lb (86.2 kg)  07/15/15 188 lb (85.3 kg)  04/04/15 186 lb 9.6 oz (84.6 kg)     Hypothyroidism, follow up:   Patient was last seen 6 months ago. No changes were made. Last TSH was checked on 07/16/2015 as was WNL.   Allergies    Allergen Reactions  . Codeine Other (See Comments)    Abdominal pain and nausea     Current Outpatient Prescriptions:  .  albuterol (PROVENTIL HFA;VENTOLIN HFA) 108 (90 Base) MCG/ACT inhaler, Inhale 2 puffs into the lungs every 6 (six) hours as needed for wheezing or shortness of breath., Disp: 1 Inhaler, Rfl: 0 .  bisoprolol-hydrochlorothiazide (ZIAC) 5-6.25 MG tablet, Take 1 tablet by mouth daily., Disp: 90 tablet, Rfl: 3 .  ferrous sulfate 325 (65 FE) MG tablet, Take by mouth. Reported on 03/06/2015, Disp: , Rfl:  .  Fluticasone-Salmeterol (ADVAIR) 100-50 MCG/DOSE AEPB, Inhale 1 puff into the lungs 2 (two) times daily., Disp: 1 each, Rfl: 3 .  levothyroxine (SYNTHROID, LEVOTHROID) 175 MCG tablet, Take 1 tablet (175 mcg total) by mouth daily before breakfast., Disp: 30 tablet, Rfl: 12 .  LORazepam (ATIVAN) 0.5 MG tablet, Take 1 tablet (0.5 mg total) by mouth at bedtime., Disp: 30 tablet, Rfl: 5 .  mirtazapine (REMERON) 30 MG tablet, Take 1 tablet (30 mg total) by mouth at bedtime., Disp: 30 tablet, Rfl: 12 .  naproxen (NAPROSYN) 500 MG tablet, Take 1 tablet (500 mg total) by mouth 2 (two) times daily with a meal., Disp: 60 tablet, Rfl: 5 .  omeprazole (PRILOSEC) 40 MG capsule, Take 1 capsule (40 mg total) by mouth daily., Disp: 30 capsule, Rfl: 12 .  sucralfate (CARAFATE) 1 g tablet, Take 1  g by mouth 4 (four) times daily -  with meals and at bedtime., Disp: , Rfl:  .  venlafaxine (EFFEXOR) 75 MG tablet, Take 1 tablet (75 mg total) by mouth 2 (two) times daily., Disp: 60 tablet, Rfl: 12  Review of Systems  Constitutional: Negative.   Respiratory: Negative.   Cardiovascular: Negative.   Endocrine: Negative.   Musculoskeletal: Positive for arthralgias.  Neurological: Negative.     Social History  Substance Use Topics  . Smoking status: Former Smoker    Quit date: 03/03/1983  . Smokeless tobacco: Not on file     Comment: quit smoking in 1985  . Alcohol use Yes     Comment: 2-3 drinks  per week   Objective:   BP 118/68 (BP Location: Right Arm, Patient Position: Sitting, Cuff Size: Normal)   Pulse 72   Temp 98.9 F (37.2 C)   Resp 16   Ht 5\' 4"  (1.626 m)   Wt 190 lb (86.2 kg)   BMI 32.61 kg/m   Physical Exam  Constitutional: She is oriented to person, place, and time. She appears well-developed and well-nourished.  HENT:  Head: Normocephalic and atraumatic.  Eyes: Conjunctivae are normal. No scleral icterus.  Neck: No thyromegaly present.  Cardiovascular: Normal rate, regular rhythm and normal heart sounds.   Pulmonary/Chest: Effort normal and breath sounds normal.  Musculoskeletal: Normal range of motion.  Neurological: She is alert and oriented to person, place, and time.  Skin: Skin is warm and dry.  Psychiatric: She has a normal mood and affect. Her behavior is normal. Judgment and thought content normal.        Assessment & Plan:     1. Essential (primary) hypertension   2. Acquired hypothyroidism  - TSH  3. Hypercholesteremia   4. Dysphonia Recurrent hoarseness. Patient has no other ENT symptoms - Ambulatory referral to ENT  5. Osteoarthritis, unspecified osteoarthritis type, unspecified site Try naproxen 500 mg twice a day for a week at a time. 6. Depression/anxiety Chronic, presently stable. 7. Chronic GERD Discussed the long-term use of PPIs in general.     I have done the exam and reviewed the above chart and it is accurate to the best of my knowledge. Development worker, community has been used in this note in any air is in the dictation or transcription are unintentional.  [name], MD  [place]

## 2016-01-14 ENCOUNTER — Telehealth: Payer: Self-pay

## 2016-01-14 DIAGNOSIS — M5136 Other intervertebral disc degeneration, lumbar region: Secondary | ICD-10-CM | POA: Diagnosis not present

## 2016-01-14 DIAGNOSIS — M9905 Segmental and somatic dysfunction of pelvic region: Secondary | ICD-10-CM | POA: Diagnosis not present

## 2016-01-14 DIAGNOSIS — M955 Acquired deformity of pelvis: Secondary | ICD-10-CM | POA: Diagnosis not present

## 2016-01-14 DIAGNOSIS — M9903 Segmental and somatic dysfunction of lumbar region: Secondary | ICD-10-CM | POA: Diagnosis not present

## 2016-01-14 LAB — TSH: TSH: 4.27 u[IU]/mL (ref 0.450–4.500)

## 2016-01-14 NOTE — Telephone Encounter (Signed)
[id number] 

## 2016-01-14 NOTE — Telephone Encounter (Signed)
-----   Message from Jerrol Banana., MD sent at 01/14/2016  8:47 AM EST ----- Thyroid in normal range now. Recheck 3-6 months

## 2016-01-15 NOTE — Telephone Encounter (Signed)
[id number] 

## 2016-01-16 MED ORDER — LEVOTHYROXINE SODIUM 175 MCG PO TABS: 175.0000 ug | ORAL_TABLET | Freq: Every day | ORAL | 3 refills | Status: DC

## 2016-01-16 NOTE — Telephone Encounter (Signed)
Patient was advised she request that you refill her prescription for Levothyroxine and send it to [place], I notified patient she does have refills on this till the end of the year she requested the refill now because she states that she wont have to worry about it in the new year. KW

## 2016-01-16 NOTE — Telephone Encounter (Signed)
Ok to refill 

## 2016-01-16 NOTE — Telephone Encounter (Signed)
RX sent in-aa 

## 2016-02-10 DIAGNOSIS — K219 Gastro-esophageal reflux disease without esophagitis: Secondary | ICD-10-CM | POA: Diagnosis not present

## 2016-02-10 DIAGNOSIS — I10 Essential (primary) hypertension: Secondary | ICD-10-CM | POA: Diagnosis not present

## 2016-02-10 DIAGNOSIS — R05 Cough: Secondary | ICD-10-CM | POA: Diagnosis not present

## 2016-02-10 DIAGNOSIS — Z885 Allergy status to narcotic agent status: Secondary | ICD-10-CM | POA: Diagnosis not present

## 2016-02-10 DIAGNOSIS — F329 Major depressive disorder, single episode, unspecified: Secondary | ICD-10-CM | POA: Diagnosis not present

## 2016-02-10 DIAGNOSIS — R490 Dysphonia: Secondary | ICD-10-CM | POA: Diagnosis not present

## 2016-02-10 DIAGNOSIS — Z79899 Other long term (current) drug therapy: Secondary | ICD-10-CM | POA: Diagnosis not present

## 2016-02-10 DIAGNOSIS — Z8249 Family history of ischemic heart disease and other diseases of the circulatory system: Secondary | ICD-10-CM | POA: Diagnosis not present

## 2016-02-10 DIAGNOSIS — E890 Postprocedural hypothyroidism: Secondary | ICD-10-CM | POA: Diagnosis not present

## 2016-02-10 DIAGNOSIS — H9191 Unspecified hearing loss, right ear: Secondary | ICD-10-CM | POA: Diagnosis not present

## 2016-02-10 DIAGNOSIS — R1310 Dysphagia, unspecified: Secondary | ICD-10-CM | POA: Diagnosis not present

## 2016-02-10 DIAGNOSIS — Z87891 Personal history of nicotine dependence: Secondary | ICD-10-CM | POA: Diagnosis not present

## 2016-02-10 DIAGNOSIS — Z823 Family history of stroke: Secondary | ICD-10-CM | POA: Diagnosis not present

## 2016-02-10 DIAGNOSIS — H9311 Tinnitus, right ear: Secondary | ICD-10-CM | POA: Diagnosis not present

## 2016-02-11 DIAGNOSIS — M9905 Segmental and somatic dysfunction of pelvic region: Secondary | ICD-10-CM | POA: Diagnosis not present

## 2016-02-11 DIAGNOSIS — M5136 Other intervertebral disc degeneration, lumbar region: Secondary | ICD-10-CM | POA: Diagnosis not present

## 2016-02-11 DIAGNOSIS — M9903 Segmental and somatic dysfunction of lumbar region: Secondary | ICD-10-CM | POA: Diagnosis not present

## 2016-02-11 DIAGNOSIS — M955 Acquired deformity of pelvis: Secondary | ICD-10-CM | POA: Diagnosis not present

## 2016-02-17 ENCOUNTER — Other Ambulatory Visit: Payer: Self-pay

## 2016-02-17 DIAGNOSIS — I10 Essential (primary) hypertension: Secondary | ICD-10-CM

## 2016-02-17 MED ORDER — LEVOTHYROXINE SODIUM 175 MCG PO TABS: 175.0000 ug | ORAL_TABLET | Freq: Every day | ORAL | 3 refills | Status: DC

## 2016-02-17 MED ORDER — BISOPROLOL-HYDROCHLOROTHIAZIDE 5-6.25 MG PO TABS: 1.0000 | ORAL_TABLET | Freq: Every day | ORAL | 3 refills | Status: DC

## 2016-02-17 MED ORDER — MIRTAZAPINE 30 MG PO TABS: 30.0000 mg | ORAL_TABLET | Freq: Every day | ORAL | 3 refills | Status: DC

## 2016-02-17 MED ORDER — OMEPRAZOLE 40 MG PO CPDR: 40.0000 mg | DELAYED_RELEASE_CAPSULE | Freq: Every day | ORAL | 3 refills | Status: DC

## 2016-02-17 MED ORDER — NAPROXEN 500 MG PO TABS: 500.0000 mg | ORAL_TABLET | Freq: Two times a day (BID) | ORAL | 3 refills | Status: DC

## 2016-02-17 MED ORDER — VENLAFAXINE HCL 75 MG PO TABS: 75.0000 mg | ORAL_TABLET | Freq: Two times a day (BID) | ORAL | 3 refills | Status: DC

## 2016-02-17 NOTE — Telephone Encounter (Signed)
Patient came by on Friday 02/14/16 stating that she will be using [facility] through [facility] insurance to have her medications filled. Pharmacy updated. Please review requests for Lorazepam and let me know, thank you. For controlled RX that can not be sent in electronically per form she dropped off we have to call [phone] to refill this.-aa

## 2016-02-18 MED ORDER — LORAZEPAM 0.5 MG PO TABS: 0.5000 mg | ORAL_TABLET | Freq: Every day | ORAL | 1 refills | Status: DC

## 2016-02-18 NOTE — Telephone Encounter (Signed)
Called number below but representative states we have to fax it they do not do call ins. Faxed Lorazepam to [phone]

## 2016-02-19 ENCOUNTER — Telehealth: Payer: Self-pay | Admitting: Family Medicine

## 2016-02-19 NOTE — Telephone Encounter (Signed)
Pt. Need prescription refill for  (EFFEXOR) 75 MG tablet bisoprolol-hydrochlorothiazide (ZIAC) 5-6.25 MG tablet [id number]  omeprazole (PRILOSEC) 40 MG capsule [id number]  levothyroxine (SYNTHROID, LEVOTHROID) 175 MCG tablet [id number] Pt has new mail order Rx and wanted to make sure she is not running out of her prescriptions before they are delivered to her home as it takes several days Meds by [place], [place] - [address]

## 2016-03-04 DIAGNOSIS — R05 Cough: Secondary | ICD-10-CM | POA: Diagnosis not present

## 2016-03-04 DIAGNOSIS — K219 Gastro-esophageal reflux disease without esophagitis: Secondary | ICD-10-CM | POA: Diagnosis not present

## 2016-03-04 DIAGNOSIS — R1310 Dysphagia, unspecified: Secondary | ICD-10-CM | POA: Diagnosis not present

## 2016-03-04 DIAGNOSIS — H9191 Unspecified hearing loss, right ear: Secondary | ICD-10-CM | POA: Diagnosis not present

## 2016-03-04 DIAGNOSIS — Z823 Family history of stroke: Secondary | ICD-10-CM | POA: Diagnosis not present

## 2016-03-04 DIAGNOSIS — I10 Essential (primary) hypertension: Secondary | ICD-10-CM | POA: Diagnosis not present

## 2016-03-04 DIAGNOSIS — F329 Major depressive disorder, single episode, unspecified: Secondary | ICD-10-CM | POA: Diagnosis not present

## 2016-03-04 DIAGNOSIS — Z885 Allergy status to narcotic agent status: Secondary | ICD-10-CM | POA: Diagnosis not present

## 2016-03-04 DIAGNOSIS — Z8249 Family history of ischemic heart disease and other diseases of the circulatory system: Secondary | ICD-10-CM | POA: Diagnosis not present

## 2016-03-04 DIAGNOSIS — R490 Dysphonia: Secondary | ICD-10-CM | POA: Diagnosis not present

## 2016-03-04 DIAGNOSIS — H9311 Tinnitus, right ear: Secondary | ICD-10-CM | POA: Diagnosis not present

## 2016-03-04 DIAGNOSIS — E890 Postprocedural hypothyroidism: Secondary | ICD-10-CM | POA: Diagnosis not present

## 2016-03-04 DIAGNOSIS — Z79899 Other long term (current) drug therapy: Secondary | ICD-10-CM | POA: Diagnosis not present

## 2016-03-04 DIAGNOSIS — Z87891 Personal history of nicotine dependence: Secondary | ICD-10-CM | POA: Diagnosis not present

## 2016-03-10 DIAGNOSIS — M955 Acquired deformity of pelvis: Secondary | ICD-10-CM | POA: Diagnosis not present

## 2016-03-10 DIAGNOSIS — M5136 Other intervertebral disc degeneration, lumbar region: Secondary | ICD-10-CM | POA: Diagnosis not present

## 2016-03-10 DIAGNOSIS — M9903 Segmental and somatic dysfunction of lumbar region: Secondary | ICD-10-CM | POA: Diagnosis not present

## 2016-03-10 DIAGNOSIS — M9905 Segmental and somatic dysfunction of pelvic region: Secondary | ICD-10-CM | POA: Diagnosis not present

## 2016-03-11 DIAGNOSIS — Z8249 Family history of ischemic heart disease and other diseases of the circulatory system: Secondary | ICD-10-CM | POA: Diagnosis not present

## 2016-03-11 DIAGNOSIS — R490 Dysphonia: Secondary | ICD-10-CM | POA: Diagnosis not present

## 2016-03-11 DIAGNOSIS — E890 Postprocedural hypothyroidism: Secondary | ICD-10-CM | POA: Diagnosis not present

## 2016-03-11 DIAGNOSIS — Z87891 Personal history of nicotine dependence: Secondary | ICD-10-CM | POA: Diagnosis not present

## 2016-03-11 DIAGNOSIS — F329 Major depressive disorder, single episode, unspecified: Secondary | ICD-10-CM | POA: Diagnosis not present

## 2016-03-11 DIAGNOSIS — I10 Essential (primary) hypertension: Secondary | ICD-10-CM | POA: Diagnosis not present

## 2016-03-11 DIAGNOSIS — K219 Gastro-esophageal reflux disease without esophagitis: Secondary | ICD-10-CM | POA: Diagnosis not present

## 2016-03-11 DIAGNOSIS — Z79899 Other long term (current) drug therapy: Secondary | ICD-10-CM | POA: Diagnosis not present

## 2016-03-11 DIAGNOSIS — Z823 Family history of stroke: Secondary | ICD-10-CM | POA: Diagnosis not present

## 2016-03-11 DIAGNOSIS — R1310 Dysphagia, unspecified: Secondary | ICD-10-CM | POA: Diagnosis not present

## 2016-03-11 DIAGNOSIS — R05 Cough: Secondary | ICD-10-CM | POA: Diagnosis not present

## 2016-03-11 DIAGNOSIS — Z885 Allergy status to narcotic agent status: Secondary | ICD-10-CM | POA: Diagnosis not present

## 2016-03-11 DIAGNOSIS — H9311 Tinnitus, right ear: Secondary | ICD-10-CM | POA: Diagnosis not present

## 2016-03-11 DIAGNOSIS — H9191 Unspecified hearing loss, right ear: Secondary | ICD-10-CM | POA: Diagnosis not present

## 2016-04-07 DIAGNOSIS — M5136 Other intervertebral disc degeneration, lumbar region: Secondary | ICD-10-CM | POA: Diagnosis not present

## 2016-04-07 DIAGNOSIS — M9905 Segmental and somatic dysfunction of pelvic region: Secondary | ICD-10-CM | POA: Diagnosis not present

## 2016-04-07 DIAGNOSIS — M9903 Segmental and somatic dysfunction of lumbar region: Secondary | ICD-10-CM | POA: Diagnosis not present

## 2016-04-07 DIAGNOSIS — M955 Acquired deformity of pelvis: Secondary | ICD-10-CM | POA: Diagnosis not present

## 2016-05-05 DIAGNOSIS — M9903 Segmental and somatic dysfunction of lumbar region: Secondary | ICD-10-CM | POA: Diagnosis not present

## 2016-05-05 DIAGNOSIS — M955 Acquired deformity of pelvis: Secondary | ICD-10-CM | POA: Diagnosis not present

## 2016-05-05 DIAGNOSIS — M9905 Segmental and somatic dysfunction of pelvic region: Secondary | ICD-10-CM | POA: Diagnosis not present

## 2016-05-05 DIAGNOSIS — M5136 Other intervertebral disc degeneration, lumbar region: Secondary | ICD-10-CM | POA: Diagnosis not present

## 2016-07-13 ENCOUNTER — Ambulatory Visit: Payer: Self-pay | Admitting: Family Medicine

## 2016-07-14 ENCOUNTER — Ambulatory Visit
Admission: RE | Admit: 2016-07-14 | Discharge: 2016-07-14 | Disposition: A | Discharge status: Home / Self Care | Payer: Medicare Other | Source: Ambulatory Visit | Attending: Family Medicine | Admitting: Family Medicine

## 2016-07-14 ENCOUNTER — Ambulatory Visit (INDEPENDENT_AMBULATORY_CARE_PROVIDER_SITE_OTHER): Payer: Medicare Other

## 2016-07-14 ENCOUNTER — Ambulatory Visit (INDEPENDENT_AMBULATORY_CARE_PROVIDER_SITE_OTHER): Payer: Medicare Other | Admitting: Family Medicine

## 2016-07-14 VITALS — BP 102/60 | HR 84 | Temp 98.7°F | Ht 64.0 in | Wt 191.2 lb

## 2016-07-14 DIAGNOSIS — R05 Cough: Secondary | ICD-10-CM | POA: Diagnosis not present

## 2016-07-14 DIAGNOSIS — R0602 Shortness of breath: Secondary | ICD-10-CM | POA: Diagnosis not present

## 2016-07-14 DIAGNOSIS — Z0000 Encounter for general adult medical examination without abnormal findings: Secondary | ICD-10-CM

## 2016-07-14 DIAGNOSIS — E7800 Pure hypercholesterolemia, unspecified: Secondary | ICD-10-CM | POA: Diagnosis not present

## 2016-07-14 DIAGNOSIS — K449 Diaphragmatic hernia without obstruction or gangrene: Secondary | ICD-10-CM | POA: Diagnosis not present

## 2016-07-14 DIAGNOSIS — I10 Essential (primary) hypertension: Secondary | ICD-10-CM | POA: Diagnosis not present

## 2016-07-14 DIAGNOSIS — E039 Hypothyroidism, unspecified: Secondary | ICD-10-CM

## 2016-07-14 NOTE — Progress Notes (Signed)
Patient: Jasmine Rich Female    DOB: 11/24/1945   71 y.o.   MRN: [id number] Visit Date: 07/14/2016  Today's Provider: [name], MD   Chief Complaint  Patient presents with  . Hypertension  . Hyperlipidemia  . Hypothyroidism   Subjective:    HPI  Hypertension, follow-up:  BP Readings from Last 3 Encounters:  07/14/16 102/60  01/13/16 118/68  07/15/15 132/64    She was last seen for hypertension 6 months ago.  BP at that visit was 118/68. Management since that visit includes no changes. She reports good compliance with treatment. She is not having side effects.  She is exercising. She is adherent to low salt diet.   Outside blood pressures are checked occasionally. She is experiencing none.  Patient denies exertional chest pressure/discomfort, near-syncope and palpitations.   Cardiovascular risk factors include dyslipidemia.     Weight trend: stable Wt Readings from Last 3 Encounters:  07/14/16 191 lb 3.2 oz (86.7 kg)  01/13/16 190 lb (86.2 kg)  07/15/15 188 lb (85.3 kg)    Current diet: well balanced    Lipid/Cholesterol, Follow-up:   Last seen for this6 months ago.  Management changes since that visit include no changes. . Last Lipid Panel:    Component Value Date/Time   CHOL 199 07/16/2015 0839   TRIG 232 (H) 07/16/2015 0839   HDL 30 (L) 07/16/2015 0839   LDLCALC 123 (H) 07/16/2015 8338    Risk factors for vascular disease include hypertension  She reports good compliance with treatment. She is not having side effects.  Current symptoms include none and have been stable. Weight trend: stable Prior visit with dietician: no Current diet: well balanced Current exercise: walking  Wt Readings from Last 3 Encounters:  07/14/16 191 lb 3.2 oz (86.7 kg)  01/13/16 190 lb (86.2 kg)  07/15/15 188 lb (85.3 kg)     Hypothyroidism, follow up: Patient was last seen in the office 6 months ago. No changes were made in her medications.  Patient is currently taking levothyroxine 176mcg, and reports good symptom control.      Allergies  Allergen Reactions  . Codeine Other (See Comments)    Abdominal pain and nausea     Current Outpatient Prescriptions:  .  albuterol (PROVENTIL HFA;VENTOLIN HFA) 108 (90 Base) MCG/ACT inhaler, Inhale 2 puffs into the lungs every 6 (six) hours as needed for wheezing or shortness of breath. (Patient not taking: Reported on 07/14/2016), Disp: 1 Inhaler, Rfl: 0 .  bisoprolol-hydrochlorothiazide (ZIAC) 5-6.25 MG tablet, Take 1 tablet by mouth daily., Disp: 90 tablet, Rfl: 3 .  ferrous sulfate 325 (65 FE) MG tablet, Take by mouth as needed. Reported on 03/06/2015, Disp: , Rfl:  .  Fluticasone-Salmeterol (ADVAIR) 100-50 MCG/DOSE AEPB, Inhale 1 puff into the lungs 2 (two) times daily. (Patient not taking: Reported on 07/14/2016), Disp: 1 each, Rfl: 3 .  levothyroxine (SYNTHROID, LEVOTHROID) 175 MCG tablet, Take 1 tablet (175 mcg total) by mouth daily before breakfast., Disp: 90 tablet, Rfl: 3 .  LORazepam (ATIVAN) 0.5 MG tablet, Take 1 tablet (0.5 mg total) by mouth at bedtime., Disp: 90 tablet, Rfl: 1 .  mirtazapine (REMERON) 30 MG tablet, Take 1 tablet (30 mg total) by mouth at bedtime. (Patient not taking: Reported on 07/14/2016), Disp: 90 tablet, Rfl: 3 .  naproxen (NAPROSYN) 500 MG tablet, Take 1 tablet (500 mg total) by mouth 2 (two) times daily with a meal., Disp: 180 tablet, Rfl: 3 .  omeprazole (PRILOSEC) 40 MG capsule, Take 1 capsule (40 mg total) by mouth daily., Disp: 90 capsule, Rfl: 3 .  sucralfate (CARAFATE) 1 g tablet, Take 1 g by mouth 4 (four) times daily -  with meals and at bedtime., Disp: , Rfl:  .  venlafaxine (EFFEXOR) 75 MG tablet, Take 1 tablet (75 mg total) by mouth 2 (two) times daily., Disp: 180 tablet, Rfl: 3  Review of Systems  Constitutional: Negative.   Respiratory: Negative.   Cardiovascular: Negative.   Endocrine: Negative.   Musculoskeletal: Negative.   Neurological:  Negative.   Hematological: Negative.     Social History  Substance Use Topics  . Smoking status: Former Smoker    Quit date: 03/03/1983  . Smokeless tobacco: Former Systems developer     Comment: quit smoking in 1985  . Alcohol use 1.2 oz/week    2 Glasses of wine per week     Comment: 2-3 drinks per week   Objective:   BP  102/60 (BP Location: Right Arm, Patient Position: Sitting)     Pulse  84     Temp  98.7 F (37.1 C)     Ht  5\' 4"  (1.626 m)     Wt  191 lb 3.2 oz (86.7 kg)      BMI  32.82 kg/m        Physical Exam  Constitutional: She is oriented to person, place, and time. She appears well-developed and well-nourished.  HENT:  Head: Normocephalic and atraumatic.  Eyes: Conjunctivae are normal. No scleral icterus.  Neck: No thyromegaly present.  Cardiovascular: Normal rate, regular rhythm and normal heart sounds.   Pulmonary/Chest: Effort normal and breath sounds normal.  Neurological: She is alert and oriented to person, place, and time.  Skin: Skin is warm and dry.  Psychiatric: She has a normal mood and affect. Her behavior is normal. Judgment and thought content normal.        Assessment & Plan:     1. Essential (primary) hypertension  - CBC with Differential/Platelet - Comprehensive metabolic panel  2. Acquired hypothyroidism  - TSH  3. Hypercholesteremia  - Lipid panel  4. Shortness of breath  - Spirometry with graph - DG Chest 2 View; Future 5.Possible asthma/COPD FEV1 83%.     I have done the exam and reviewed the above chart and it is accurate to the best of my knowledge. Development worker, community has been used in this note in any air is in the dictation or transcription are unintentional. I have done the exam and reviewed the above chart and it is accurate to the best of my knowledge. Development worker, community has been used in this note in any air is in the dictation or transcription are unintentional.  [name], MD  [place]

## 2016-07-14 NOTE — Patient Instructions (Addendum)
Jasmine Rich , Thank you for taking time to come for your Medicare Wellness Visit. I appreciate your ongoing commitment to your health goals. Please review the following plan we discussed and let me know if I can assist you in the future.   Screening recommendations/referrals: Colonoscopy: Completed 08/14/2011, due 08/02/2021 Mammogram: Completed 04/30/2015, due 04/29/2017 Bone Density: Completed 07/07/2009 Recommended yearly ophthalmology/optometry visit for glaucoma screening and checkup Recommended yearly dental visit for hygiene and checkup  Vaccinations: Influenza vaccine: up to date, due 11/2016 Pneumococcal vaccine: Prevnar 13 due, check with [facility] to see if this one was done.  Tdap vaccine: up to date, due 09/2020 Shingles vaccine: due now, check with your insurance company on coverage.   Advanced directives: Advance directive discussed with you today. I have provided a copy for you to complete at home and have notarized. Once this is complete please bring a copy in to our office so we can scan it into your chart.  Conditions/risks identified: Recommend drinking 4-5 glasses of water a day.  Next appointment: none, follow up in one year for annual wellness exam.    Preventive Care 65 Years and Older, Female Preventive care refers to lifestyle choices and visits with your health care provider that can promote health and wellness. What does preventive care include?  A yearly physical exam. This is also called an annual well check.  Dental exams once or twice a year.  Routine eye exams. Ask your health care provider how often you should have your eyes checked.  Personal lifestyle choices, including:  Daily care of your teeth and gums.  Regular physical activity.  Eating a healthy diet.  Avoiding tobacco and drug use.  Limiting alcohol use.  Practicing safe sex.  Taking low-dose aspirin every day.  Taking vitamin and mineral supplements as recommended by your health care  provider. What happens during an annual well check? The services and screenings done by your health care provider during your annual well check will depend on your age, overall health, lifestyle risk factors, and family history of disease. Counseling  Your health care provider may ask you questions about your:  Alcohol use.  Tobacco use.  Drug use.  Emotional well-being.  Home and relationship well-being.  Sexual activity.  Eating habits.  History of falls.  Memory and ability to understand (cognition).  Work and work Statistician.  Reproductive health. Screening  You may have the following tests or measurements:  Height, weight, and BMI.  Blood pressure.  Lipid and cholesterol levels. These may be checked every 5 years, or more frequently if you are over 13 years old.  Skin check.  Lung cancer screening. You may have this screening every year starting at age 54 if you have a 30-pack-year history of smoking and currently smoke or have quit within the past 15 years.  Fecal occult blood test (FOBT) of the stool. You may have this test every year starting at age 68.  Flexible sigmoidoscopy or colonoscopy. You may have a sigmoidoscopy every 5 years or a colonoscopy every 10 years starting at age 3.  Hepatitis C blood test.  Hepatitis B blood test.  Sexually transmitted disease (STD) testing.  Diabetes screening. This is done by checking your blood sugar (glucose) after you have not eaten for a while (fasting). You may have this done every 1-3 years.  Bone density scan. This is done to screen for osteoporosis. You may have this done starting at age 33.  Mammogram. This may be done every  1-2 years. Talk to your health care provider about how often you should have regular mammograms. Talk with your health care provider about your test results, treatment options, and if necessary, the need for more tests. Vaccines  Your health care provider may recommend certain  vaccines, such as:  Influenza vaccine. This is recommended every year.  Tetanus, diphtheria, and acellular pertussis (Tdap, Td) vaccine. You may need a Td booster every 10 years.  Zoster vaccine. You may need this after age 19.  Pneumococcal 13-valent conjugate (PCV13) vaccine. One dose is recommended after age 49.  Pneumococcal polysaccharide (PPSV23) vaccine. One dose is recommended after age 61. Talk to your health care provider about which screenings and vaccines you need and how often you need them. This information is not intended to replace advice given to you by your health care provider. Make sure you discuss any questions you have with your health care provider. Document Released: 03/15/2015 Document Revised: 11/06/2015 Document Reviewed: 12/18/2014 Elsevier Interactive Patient Education  2017 [place] Prevention in the Home Falls can cause injuries. They can happen to people of all ages. There are many things you can do to make your home safe and to help prevent falls. What can I do on the outside of my home?  Regularly fix the edges of walkways and driveways and fix any cracks.  Remove anything that might make you trip as you walk through a door, such as a raised step or threshold.  Trim any bushes or trees on the path to your home.  Use bright outdoor lighting.  Clear any walking paths of anything that might make someone trip, such as rocks or tools.  Regularly check to see if handrails are loose or broken. Make sure that both sides of any steps have handrails.  Any raised decks and porches should have guardrails on the edges.  Have any leaves, snow, or ice cleared regularly.  Use sand or salt on walking paths during winter.  Clean up any spills in your garage right away. This includes oil or grease spills. What can I do in the bathroom?  Use night lights.  Install grab bars by the toilet and in the tub and shower. Do not use towel bars as grab  bars.  Use non-skid mats or decals in the tub or shower.  If you need to sit down in the shower, use a plastic, non-slip stool.  Keep the floor dry. Clean up any water that spills on the floor as soon as it happens.  Remove soap buildup in the tub or shower regularly.  Attach bath mats securely with double-sided non-slip rug tape.  Do not have throw rugs and other things on the floor that can make you trip. What can I do in the bedroom?  Use night lights.  Make sure that you have a light by your bed that is easy to reach.  Do not use any sheets or blankets that are too big for your bed. They should not hang down onto the floor.  Have a firm chair that has side arms. You can use this for support while you get dressed.  Do not have throw rugs and other things on the floor that can make you trip. What can I do in the kitchen?  Clean up any spills right away.  Avoid walking on wet floors.  Keep items that you use a lot in easy-to-reach places.  If you need to reach something above you, use a strong  step stool that has a grab bar.  Keep electrical cords out of the way.  Do not use floor polish or wax that makes floors slippery. If you must use wax, use non-skid floor wax.  Do not have throw rugs and other things on the floor that can make you trip. What can I do with my stairs?  Do not leave any items on the stairs.  Make sure that there are handrails on both sides of the stairs and use them. Fix handrails that are broken or loose. Make sure that handrails are as long as the stairways.  Check any carpeting to make sure that it is firmly attached to the stairs. Fix any carpet that is loose or worn.  Avoid having throw rugs at the top or bottom of the stairs. If you do have throw rugs, attach them to the floor with carpet tape.  Make sure that you have a light switch at the top of the stairs and the bottom of the stairs. If you do not have them, ask someone to add them for  you. What else can I do to help prevent falls?  Wear shoes that:  Do not have high heels.  Have rubber bottoms.  Are comfortable and fit you well.  Are closed at the toe. Do not wear sandals.  If you use a stepladder:  Make sure that it is fully opened. Do not climb a closed stepladder.  Make sure that both sides of the stepladder are locked into place.  Ask someone to hold it for you, if possible.  Clearly mark and make sure that you can see:  Any grab bars or handrails.  First and last steps.  Where the edge of each step is.  Use tools that help you move around (mobility aids) if they are needed. These include:  Canes.  Walkers.  Scooters.  Crutches.  Turn on the lights when you go into a dark area. Replace any light bulbs as soon as they burn out.  Set up your furniture so you have a clear path. Avoid moving your furniture around.  If any of your floors are uneven, fix them.  If there are any pets around you, be aware of where they are.  Review your medicines with your doctor. Some medicines can make you feel dizzy. This can increase your chance of falling. Ask your doctor what other things that you can do to help prevent falls. This information is not intended to replace advice given to you by your health care provider. Make sure you discuss any questions you have with your health care provider. Document Released: 12/13/2008 Document Revised: 07/25/2015 Document Reviewed: 03/23/2014 Elsevier Interactive Patient Education  2017 [employer].

## 2016-07-14 NOTE — Progress Notes (Signed)
Subjective:   Jasmine Rich is a 71 y.o. female who presents for Medicare Annual (Subsequent) preventive examination.  Review of Systems:  Cardiac Risk Factors include: advanced age (>22men, >79 women);obesity (BMI >30kg/m2);dyslipidemia;hypertension     Objective:     Vitals: BP 102/60 (BP Location: Right Arm, Patient Position: Sitting)   Pulse 84   Temp 98.7 F (37.1 C)   Ht 5\' 4"  (1.626 m)   Wt 191 lb 3.2 oz (86.7 kg)   BMI 32.82 kg/m   Body mass index is 32.82 kg/m.   Tobacco History  Smoking Status  . Former Smoker  . Quit date: 03/03/1983  Smokeless Tobacco  . Former Systems developer    Comment: quit smoking in [place] given: Not Answered   Past Medical History:  Diagnosis Date  . Anemia   . Anxiety   . Depression   . GERD (gastroesophageal reflux disease)   . Hypertension   . Thyroid disease   . Tremor, unspecified    throat tremor- diagnosed at [place]   Past Surgical History:  Procedure Laterality Date  . ABDOMINAL HYSTERECTOMY  1983  . ABDOMINAL HYSTERECTOMY    . CATARACT EXTRACTION, BILATERAL  08/2015  . cataract removed    . FOOT SURGERY    . HAND SURGERY    . INNER EAR SURGERY    . LASIK  11/2015  . MIDDLE EAR SURGERY    . THYROIDECTOMY  10/1988  . THYROIDECTOMY     Family History  Problem Relation Age of Onset  . Cancer Mother        Lung and brain cancer. Died from Non- Hodgkin's Lymphoma  . Lung cancer Mother   . Brain cancer Mother   . Hypothyroidism Son   . Stroke Father   . Hypertension Father   . Alcohol abuse Sister   . Diabetes Sister   . COPD Brother   . Prostate cancer Paternal Grandfather   . Alcohol abuse Brother   . Diabetes Brother   . Thyroid disease Son   . Arthritis Son   . Hashimoto's thyroiditis Son   . Breast cancer Maternal Grandmother    History  Sexual Activity  . Sexual activity: Not Currently  . Birth control/ protection: Surgical    Outpatient Encounter Prescriptions as of 07/14/2016    Medication Sig  . bisoprolol-hydrochlorothiazide (ZIAC) 5-6.25 MG tablet Take 1 tablet by mouth daily.  . ferrous sulfate 325 (65 FE) MG tablet Take by mouth as needed. Reported on 03/06/2015  . levothyroxine (SYNTHROID, LEVOTHROID) 175 MCG tablet Take 1 tablet (175 mcg total) by mouth daily before breakfast.  . LORazepam (ATIVAN) 0.5 MG tablet Take 1 tablet (0.5 mg total) by mouth at bedtime.  . naproxen (NAPROSYN) 500 MG tablet Take 1 tablet (500 mg total) by mouth 2 (two) times daily with a meal.  . omeprazole (PRILOSEC) 40 MG capsule Take 1 capsule (40 mg total) by mouth daily.  Marland Kitchen venlafaxine (EFFEXOR) 75 MG tablet Take 1 tablet (75 mg total) by mouth 2 (two) times daily.  Marland Kitchen albuterol (PROVENTIL HFA;VENTOLIN HFA) 108 (90 Base) MCG/ACT inhaler Inhale 2 puffs into the lungs every 6 (six) hours as needed for wheezing or shortness of breath. (Patient not taking: Reported on 07/14/2016)  . Fluticasone-Salmeterol (ADVAIR) 100-50 MCG/DOSE AEPB Inhale 1 puff into the lungs 2 (two) times daily. (Patient not taking: Reported on 07/14/2016)  . mirtazapine (REMERON) 30 MG tablet Take 1 tablet (30 mg total) by  mouth at bedtime. (Patient not taking: Reported on 07/14/2016)  . sucralfate (CARAFATE) 1 g tablet Take 1 g by mouth 4 (four) times daily -  with meals and at bedtime.   No facility-administered encounter medications on file as of 07/14/2016.     Activities of Daily Living In your present state of health, do you have any difficulty performing the following activities: 07/14/2016  Hearing? Y  Vision? N  Difficulty concentrating or making decisions? N  Walking or climbing stairs? Y  Dressing or bathing? N  Preparing Food and eating ? N  Using the Toilet? N  In the past six months, have you accidently leaked urine? N  Do you have problems with loss of bowel control? N  Managing your Medications? N  Managing your Finances? N  Housekeeping or managing your Housekeeping? N  Some recent data might be  hidden    Patient Care Team: Jerrol Banana., MD as PCP - General (Family Medicine)    Assessment:     Exercise Activities and Dietary recommendations Current Exercise Habits: Home exercise routine (pt wants to start yoga), Type of exercise: walking, Time (Minutes): 15, Frequency (Times/Week): 7, Weekly Exercise (Minutes/Week): 105, Intensity: Mild  Goals    . Increase water intake          Recommend drinking 4-5 glasses of water a day.      Fall Risk Fall Risk  07/14/2016 03/06/2015  Falls in the past year? No No   Depression Screen PHQ 2/9 Scores 07/14/2016 07/14/2016 03/06/2015  PHQ - 2 Score 0 2 0  PHQ- 9 Score - 6 -     Cognitive Function        Immunization History  Administered Date(s) Administered  . Influenza-Unspecified 11/30/2012, 12/17/2014  . Pneumococcal Polysaccharide-23 10/27/2010, 10/27/2010  . Tdap 10/27/2010, 10/27/2010   Screening Tests Health Maintenance  Topic Date Due  . PNA vac Low Risk Adult (2 of 2 - PCV13) 10/27/2011  . INFLUENZA VACCINE  09/30/2016  . MAMMOGRAM  04/16/2017  . TETANUS/TDAP  10/26/2020  . COLONOSCOPY  08/03/2021  . DEXA SCAN  Completed  . Hepatitis C Screening  Completed      Plan:    I have personally reviewed and addressed the Medicare Annual Wellness questionnaire and have noted the following in the patient's chart:  A. Medical and social history B. Use of alcohol, tobacco or illicit drugs  C. Current medications and supplements D. Functional ability and status E.  Nutritional status F.  Physical activity G. Advance directives H. List of other physicians I.  Hospitalizations, surgeries, and ER visits in previous 12 months J.  Phenix such as hearing and vision if needed, cognitive and depression L. Referrals and appointments - none  In addition, I have reviewed and discussed with patient certain preventive protocols, quality metrics, and best practice recommendations. A written personalized  care plan for preventive services as well as general preventive health recommendations were provided to patient.  See attached scanned questionnaire for additional information.   Signed,  Tyler Aas, LPN Nurse Health Advisor   MD Recommendations: none I have reviewed the health advisors note, was  available for consultation and I agree with documentation and plan. Miguel Aschoff MD [facility]

## 2016-07-15 DIAGNOSIS — E039 Hypothyroidism, unspecified: Secondary | ICD-10-CM | POA: Diagnosis not present

## 2016-07-15 DIAGNOSIS — E7800 Pure hypercholesterolemia, unspecified: Secondary | ICD-10-CM | POA: Diagnosis not present

## 2016-07-15 DIAGNOSIS — I10 Essential (primary) hypertension: Secondary | ICD-10-CM | POA: Diagnosis not present

## 2016-07-16 ENCOUNTER — Telehealth: Payer: Self-pay

## 2016-07-16 LAB — CBC WITH DIFFERENTIAL/PLATELET
Basophils Absolute: 0 10*3/uL (ref 0.0–0.2)
Basos: 0 %
EOS (ABSOLUTE): 0 10*3/uL (ref 0.0–0.4)
EOS: 0 %
HEMATOCRIT: 33.5 % — AB (ref 34.0–46.6)
Hemoglobin: 10 g/dL — ABNORMAL LOW (ref 11.1–15.9)
IMMATURE GRANS (ABS): 0 10*3/uL (ref 0.0–0.1)
IMMATURE GRANULOCYTES: 0 %
LYMPHS: 31 %
Lymphocytes Absolute: 1.5 10*3/uL (ref 0.7–3.1)
MCH: 24.6 pg — ABNORMAL LOW (ref 26.6–33.0)
MCHC: 29.9 g/dL — ABNORMAL LOW (ref 31.5–35.7)
MCV: 83 fL (ref 79–97)
MONOCYTES: 12 %
MONOS ABS: 0.6 10*3/uL (ref 0.1–0.9)
Neutrophils Absolute: 2.7 10*3/uL (ref 1.4–7.0)
Neutrophils: 57 %
Platelets: 359 10*3/uL (ref 150–379)
RBC: 4.06 x10E6/uL (ref 3.77–5.28)
RDW: 16.1 % — AB (ref 12.3–15.4)
WBC: 4.8 10*3/uL (ref 3.4–10.8)

## 2016-07-16 LAB — COMPREHENSIVE METABOLIC PANEL
A/G RATIO: 2 (ref 1.2–2.2)
ALT: 12 IU/L (ref 0–32)
AST: 19 IU/L (ref 0–40)
Albumin: 4.2 g/dL (ref 3.5–4.8)
Alkaline Phosphatase: 83 IU/L (ref 39–117)
BUN/Creatinine Ratio: 14 (ref 12–28)
BUN: 14 mg/dL (ref 8–27)
Bilirubin Total: 0.2 mg/dL (ref 0.0–1.2)
CALCIUM: 9.3 mg/dL (ref 8.7–10.3)
CO2: 24 mmol/L (ref 18–29)
CREATININE: 0.97 mg/dL (ref 0.57–1.00)
Chloride: 102 mmol/L (ref 96–106)
GFR, EST AFRICAN AMERICAN: 68 mL/min/{1.73_m2} (ref >59)
GFR, EST NON AFRICAN AMERICAN: 59 mL/min/{1.73_m2} — AB (ref >59)
GLOBULIN, TOTAL: 2.1 g/dL (ref 1.5–4.5)
Glucose: 114 mg/dL — ABNORMAL HIGH (ref 65–99)
Potassium: 4.7 mmol/L (ref 3.5–5.2)
SODIUM: 141 mmol/L (ref 134–144)
TOTAL PROTEIN: 6.3 g/dL (ref 6.0–8.5)

## 2016-07-16 LAB — LIPID PANEL
CHOL/HDL RATIO: 6.2 ratio — AB (ref 0.0–4.4)
Cholesterol, Total: 186 mg/dL (ref 100–199)
HDL: 30 mg/dL — ABNORMAL LOW (ref >39)
LDL CALC: 106 mg/dL — AB (ref 0–99)
Triglycerides: 251 mg/dL — ABNORMAL HIGH (ref 0–149)
VLDL Cholesterol Cal: 50 mg/dL — ABNORMAL HIGH (ref 5–40)

## 2016-07-16 LAB — TSH: TSH: 0.023 u[IU]/mL — AB (ref 0.450–4.500)

## 2016-07-16 NOTE — Telephone Encounter (Signed)
Pt advised. Dewana Ammirati Drozdowski, CMA  

## 2016-07-16 NOTE — Telephone Encounter (Signed)
-----   Message from Jerrol Banana., MD sent at 07/16/2016  2:18 PM EDT ----- Slight bronchitic changes. Hiatal hernia present. Consider spirometry next OV.

## 2016-07-20 ENCOUNTER — Telehealth: Payer: Self-pay

## 2016-07-20 DIAGNOSIS — D649 Anemia, unspecified: Secondary | ICD-10-CM

## 2016-07-20 NOTE — Telephone Encounter (Signed)
-----   Message from Jerrol Banana., MD sent at 07/20/2016 11:07 AM EDT ----- Mildly anemic--repeat CBC with iron this week.

## 2016-07-20 NOTE — Telephone Encounter (Signed)
lmtcb Emily Drozdowski, CMA  

## 2016-07-20 NOTE — Telephone Encounter (Signed)
error 

## 2016-07-22 DIAGNOSIS — D649 Anemia, unspecified: Secondary | ICD-10-CM | POA: Diagnosis not present

## 2016-07-22 NOTE — Telephone Encounter (Signed)
Pt advised.  She says she will try to get the labs today.   Thanks,   -Mickel Baas

## 2016-07-23 LAB — CBC WITH DIFFERENTIAL/PLATELET
BASOS ABS: 0 10*3/uL (ref 0.0–0.2)
Basos: 0 %
EOS (ABSOLUTE): 0 10*3/uL (ref 0.0–0.4)
Eos: 0 %
Hematocrit: 32.7 % — ABNORMAL LOW (ref 34.0–46.6)
Hemoglobin: 10.5 g/dL — ABNORMAL LOW (ref 11.1–15.9)
Immature Grans (Abs): 0 10*3/uL (ref 0.0–0.1)
Immature Granulocytes: 0 %
LYMPHS: 31 %
Lymphocytes Absolute: 1.9 10*3/uL (ref 0.7–3.1)
MCH: 25.8 pg — AB (ref 26.6–33.0)
MCHC: 32.1 g/dL (ref 31.5–35.7)
MCV: 80 fL (ref 79–97)
MONOCYTES: 15 %
Monocytes Absolute: 1 10*3/uL — ABNORMAL HIGH (ref 0.1–0.9)
Neutrophils Absolute: 3.3 10*3/uL (ref 1.4–7.0)
Neutrophils: 54 %
Platelets: 415 10*3/uL — ABNORMAL HIGH (ref 150–379)
RBC: 4.07 x10E6/uL (ref 3.77–5.28)
RDW: 15.9 % — AB (ref 12.3–15.4)
WBC: 6.2 10*3/uL (ref 3.4–10.8)

## 2016-07-23 LAB — IRON AND TIBC
Iron Saturation: 8 % — CL (ref 15–55)
Iron: 32 ug/dL (ref 27–139)
TIBC: 422 ug/dL (ref 250–450)
UIBC: 390 ug/dL — AB (ref 118–369)

## 2016-07-23 LAB — FERRITIN: Ferritin: 10 ng/mL — ABNORMAL LOW (ref 15–150)

## 2016-09-23 ENCOUNTER — Encounter: Payer: Self-pay | Admitting: Family Medicine

## 2016-09-23 ENCOUNTER — Ambulatory Visit (INDEPENDENT_AMBULATORY_CARE_PROVIDER_SITE_OTHER): Payer: Medicare Other | Admitting: Family Medicine

## 2016-09-23 VITALS — BP 110/64 | HR 72 | Temp 97.7°F | Resp 16 | Wt 191.0 lb

## 2016-09-23 DIAGNOSIS — D649 Anemia, unspecified: Secondary | ICD-10-CM

## 2016-09-23 NOTE — Progress Notes (Signed)
Subjective:  HPI Pt is here for a follow up for anemia. Last labs were checked on 07/22/16 and she was to start Iron daily and recheck in 2 months. Pt reports that she is feeling much better since starting iron. No side effects to the iron. She is due to have this rechecked today.   Prior to Admission medications   Medication Sig Start Date End Date Taking? Authorizing Provider  albuterol (PROVENTIL HFA;VENTOLIN HFA) 108 (90 Base) MCG/ACT inhaler Inhale 2 puffs into the lungs every 6 (six) hours as needed for wheezing or shortness of breath. Patient not taking: Reported on 07/14/2016 07/15/15   Jerrol Banana., MD  bisoprolol-hydrochlorothiazide [facility]) 5-6.25 MG tablet Take 1 tablet by mouth daily. 02/17/16   Jerrol Banana., MD  ferrous sulfate 325 (65 FE) MG tablet Take by mouth as needed. Reported on 03/06/2015    [provider]  Fluticasone-Salmeterol (ADVAIR) 100-50 MCG/DOSE AEPB Inhale 1 puff into the lungs 2 (two) times daily. Patient not taking: Reported on 07/14/2016 07/15/15   Jerrol Banana., MD  levothyroxine Wil[place] Floor, LEVOTHROID) 175 MCG tablet Take 1 tablet (175 mcg total) by mouth daily before breakfast. 02/17/16   Jerrol Banana., MD  LORazepam (ATIVAN) 0.5 MG tablet Take 1 tablet (0.5 mg total) by mouth at bedtime. 02/18/16   Jerrol Banana., MD  mirtazapine (REMERON) 30 MG tablet Take 1 tablet (30 mg total) by mouth at bedtime. Patient not taking: Reported on 07/14/2016 02/17/16   Jerrol Banana., MD  naproxen (NAPROSYN) 500 MG tablet Take 1 tablet (500 mg total) by mouth 2 (two) times daily with a meal. 02/17/16   Jerrol Banana., MD  omeprazole (PRILOSEC) 40 MG capsule Take 1 capsule (40 mg total) by mouth daily. 02/17/16   Jerrol Banana., MD  sucralfate (CARAFATE) 1 g tablet Take 1 g by mouth 4 (four) times daily -  with meals and at bedtime.    [provider]  venlafaxine (EFFEXOR) 75 MG tablet Take  1 tablet (75 mg total) by mouth 2 (two) times daily. 02/17/16   Jerrol Banana., MD    Patient Active Problem List   Diagnosis Date Noted  . Perimenopausal vasomotor symptoms 04/04/2015  . Increased BMI 04/04/2015  . Acquired hypothyroidism 08/27/2014  . Absolute anemia 08/27/2014  . Anxiety 08/27/2014  . Arthritis 08/27/2014  . Cataract 08/27/2014  . Hearing loss 08/27/2014  . Clinical depression 08/27/2014  . Essential (primary) hypertension 08/27/2014  . Esophagitis, reflux 08/27/2014  . Diaphragmatic hernia 08/27/2014  . Hypercholesteremia 08/27/2014  . Cannot sleep 08/27/2014  . Adiposity 08/27/2014  . Acquired hypothyroidism 07/27/2014  . Absolute anemia 07/27/2014  . Anxiety 07/27/2014  . Arthritis 07/27/2014  . Cataract 07/27/2014  . Hearing loss 07/27/2014  . Clinical depression 07/27/2014  . Essential (primary) hypertension 07/27/2014  . Esophagitis, reflux 07/27/2014  . Diaphragmatic hernia 07/27/2014  . Hypercholesteremia 07/27/2014  . Adiposity 07/27/2014    Past Medical History:  Diagnosis Date  . Anemia   . Anxiety   . Depression   . GERD (gastroesophageal reflux disease)   . Hypertension   . Thyroid disease   . Tremor, unspecified    throat tremor- diagnosed at [place] History  . Marital status: Married    Spouse name: N/A  . Number of children: N/A  . Years of education: N/A   Occupational  History  . Not on file.   Social History Main Topics  . Smoking status: Former Smoker    Quit date: 03/03/1983  . Smokeless tobacco: Former Systems developer     Comment: quit smoking in 1985  . Alcohol use 1.2 oz/week    2 Glasses of wine per week     Comment: 2-3 drinks per week  . Drug use: No  . Sexual activity: Not Currently    Birth control/ protection: Surgical   Other Topics Concern  . Not on file   Social History Narrative   ** Merged History Encounter **        Allergies  Allergen Reactions  . Codeine  Other (See Comments)    Abdominal pain and nausea    Review of Systems  Constitutional: Negative.   HENT: Negative.   Eyes: Negative.   Respiratory: Negative.   Cardiovascular: Negative.   Gastrointestinal: Negative.   Genitourinary: Negative.   Musculoskeletal: Negative.   Skin: Negative.   Neurological: Negative.   Endo/Heme/Allergies: Negative.   Psychiatric/Behavioral: Negative.     Immunization History  Administered Date(s) Administered  . Influenza-Unspecified 11/30/2012, 12/17/2014  . Pneumococcal Polysaccharide-23 10/27/2010, 10/27/2010  . Tdap 10/27/2010, 10/27/2010    Objective:  BP 110/64 (BP Location: Left Arm, Patient Position: Sitting, Cuff Size: Normal)   Pulse 72   Temp 97.7 F (36.5 C) (Oral)   Resp 16   Wt 191 lb (86.6 kg)   BMI 32.79 kg/m   Physical Exam  Constitutional: She is oriented to person, place, and time and well-developed, well-nourished, and in no distress.  Eyes: Pupils are equal, round, and reactive to light. Conjunctivae and EOM are normal.  Neck: Normal range of motion. Neck supple.  Cardiovascular: Normal rate, regular rhythm, normal heart sounds and intact distal pulses.   Pulmonary/Chest: Effort normal and breath sounds normal.  Musculoskeletal: Normal range of motion.  Neurological: She is alert and oriented to person, place, and time. She has normal reflexes. Gait normal. GCS score is 15.  Skin: Skin is warm and dry.  Psychiatric: Mood, memory, affect and judgment normal.    Lab Results  Component Value Date   WBC 6.2 07/22/2016   HGB 10.5 (L) 07/22/2016   HCT 32.7 (L) 07/22/2016   PLT 415 (H) 07/22/2016   GLUCOSE 114 (H) 07/15/2016   CHOL 186 07/15/2016   TRIG 251 (H) 07/15/2016   HDL 30 (L) 07/15/2016   LDLCALC 106 (H) 07/15/2016   TSH 0.023 (L) 07/15/2016    CMP     Component Value Date/Time   NA 141 07/15/2016 0837   K 4.7 07/15/2016 0837   CL 102 07/15/2016 0837   CO2 24 07/15/2016 0837   GLUCOSE 114 (H)  07/15/2016 0837   BUN 14 07/15/2016 0837   CREATININE 0.97 07/15/2016 0837   CALCIUM 9.3 07/15/2016 0837   PROT 6.3 07/15/2016 0837   ALBUMIN 4.2 07/15/2016 0837   AST 19 07/15/2016 0837   ALT 12 07/15/2016 0837   ALKPHOS 83 07/15/2016 0837   BILITOT 0.2 07/15/2016 0837   GFRNONAA 59 (L) 07/15/2016 0837   GFRAA 68 07/15/2016 0837    Assessment and Plan :  1. Anemia, unspecified type Improved. Follow up in 3 months unless labs indicate otherwise. - Iron - CBC with Differential/Platelet 2.HTN 3.MDD/GAD  HPI, Exam, and A&P Transcribed under the direction and in the presence of Richard L. Cranford Mon, MD  I have done the exam and reviewed the chart and it is  accurate to the best of my knowledge. Development worker, community has been used and  any errors in dictation or transcription are unintentional. Miguel Aschoff M.D. [facility]  Electronically Signed: Katina Dung, Clay MD [facility] 09/23/2016 11:13 AM

## 2016-09-24 LAB — CBC WITH DIFFERENTIAL/PLATELET
BASOS ABS: 0 10*3/uL (ref 0.0–0.2)
BASOS: 0 %
EOS (ABSOLUTE): 0 10*3/uL (ref 0.0–0.4)
Eos: 0 %
Hematocrit: 36.7 % (ref 34.0–46.6)
Hemoglobin: 12 g/dL (ref 11.1–15.9)
IMMATURE GRANS (ABS): 0 10*3/uL (ref 0.0–0.1)
IMMATURE GRANULOCYTES: 1 %
LYMPHS: 31 %
Lymphocytes Absolute: 2 10*3/uL (ref 0.7–3.1)
MCH: 28 pg (ref 26.6–33.0)
MCHC: 32.7 g/dL (ref 31.5–35.7)
MCV: 86 fL (ref 79–97)
Monocytes Absolute: 1 10*3/uL — ABNORMAL HIGH (ref 0.1–0.9)
Monocytes: 15 %
NEUTROS PCT: 53 %
Neutrophils Absolute: 3.6 10*3/uL (ref 1.4–7.0)
PLATELETS: 338 10*3/uL (ref 150–379)
RBC: 4.29 x10E6/uL (ref 3.77–5.28)
RDW: 18.1 % — ABNORMAL HIGH (ref 12.3–15.4)
WBC: 6.6 10*3/uL (ref 3.4–10.8)

## 2016-09-24 LAB — IRON: Iron: 298 ug/dL (ref 27–139)

## 2016-10-14 DIAGNOSIS — M9902 Segmental and somatic dysfunction of thoracic region: Secondary | ICD-10-CM | POA: Diagnosis not present

## 2016-10-14 DIAGNOSIS — M62830 Muscle spasm of back: Secondary | ICD-10-CM | POA: Diagnosis not present

## 2016-10-14 DIAGNOSIS — M9903 Segmental and somatic dysfunction of lumbar region: Secondary | ICD-10-CM | POA: Diagnosis not present

## 2016-10-14 DIAGNOSIS — M5136 Other intervertebral disc degeneration, lumbar region: Secondary | ICD-10-CM | POA: Diagnosis not present

## 2016-11-17 ENCOUNTER — Ambulatory Visit (INDEPENDENT_AMBULATORY_CARE_PROVIDER_SITE_OTHER): Payer: Medicare Other | Admitting: Family Medicine

## 2016-11-17 ENCOUNTER — Encounter: Payer: Self-pay | Admitting: Family Medicine

## 2016-11-17 VITALS — BP 102/60 | HR 86 | Temp 97.6°F | Resp 16 | Wt 192.0 lb

## 2016-11-17 DIAGNOSIS — D649 Anemia, unspecified: Secondary | ICD-10-CM

## 2016-11-17 DIAGNOSIS — Z23 Encounter for immunization: Secondary | ICD-10-CM | POA: Diagnosis not present

## 2016-11-17 DIAGNOSIS — E039 Hypothyroidism, unspecified: Secondary | ICD-10-CM | POA: Diagnosis not present

## 2016-11-17 LAB — CBC WITH DIFFERENTIAL/PLATELET
BASOS ABS: 0 {cells}/uL (ref 0–200)
BASOS PCT: 0 %
Eosinophils Absolute: 0 cells/uL — ABNORMAL LOW (ref 15–500)
Eosinophils Relative: 0 %
HEMATOCRIT: 34 % — AB (ref 35.0–45.0)
HEMOGLOBIN: 11.1 g/dL — AB (ref 11.7–15.5)
Lymphs Abs: 2114 cells/uL (ref 850–3900)
MCH: 27.6 pg (ref 27.0–33.0)
MCHC: 32.6 g/dL (ref 32.0–36.0)
MCV: 84.6 fL (ref 80.0–100.0)
MPV: 10.8 fL (ref 7.5–12.5)
Monocytes Relative: 15.7 %
NEUTROS ABS: 3787 {cells}/uL (ref 1500–7800)
Neutrophils Relative %: 54.1 %
Platelets: 351 10*3/uL (ref 140–400)
RBC: 4.02 10*6/uL (ref 3.80–5.10)
RDW: 13.5 % (ref 11.0–15.0)
Total Lymphocyte: 30.2 %
WBC: 7 10*3/uL (ref 3.8–10.8)
WBCMIX: 1099 {cells}/uL — AB (ref 200–950)

## 2016-11-17 LAB — TSH: TSH: 0.24 mIU/L — ABNORMAL LOW (ref 0.40–4.50)

## 2016-11-17 LAB — IRON: IRON: 54 ug/dL (ref 45–160)

## 2016-11-17 NOTE — Patient Instructions (Signed)
See if your pharmacy can give you the Prevnar vaccine (Pneumonia vaccine) when they give you the flu vaccine.

## 2016-11-17 NOTE — Progress Notes (Addendum)
Patient: Jasmine Rich Female    DOB: 15-May-1945   71 y.o.   MRN: [id number] Visit Date: 11/17/2016  Today's Provider: [name], MD   Chief Complaint  Patient presents with  . Anemia  . Hypertension   Subjective:    HPI Pt is here today for a 3 month follow up on her anemia. She reports that she is feeling well, no complaints today. Last labs done 09/23/16- ok to stop iron.       Allergies  Allergen Reactions  . Codeine Other (See Comments)    Abdominal pain and nausea     Current Outpatient Prescriptions:  .  bisoprolol-hydrochlorothiazide (ZIAC) 5-6.25 MG tablet, Take 1 tablet by mouth daily., Disp: 90 tablet, Rfl: 3 .  levothyroxine (SYNTHROID, LEVOTHROID) 175 MCG tablet, Take 1 tablet (175 mcg total) by mouth daily before breakfast., Disp: 90 tablet, Rfl: 3 .  LORazepam (ATIVAN) 0.5 MG tablet, Take 1 tablet (0.5 mg total) by mouth at bedtime., Disp: 90 tablet, Rfl: 1 .  mirtazapine (REMERON) 30 MG tablet, Take 1 tablet (30 mg total) by mouth at bedtime., Disp: 90 tablet, Rfl: 3 .  naproxen (NAPROSYN) 500 MG tablet, Take 1 tablet (500 mg total) by mouth 2 (two) times daily with a meal., Disp: 180 tablet, Rfl: 3 .  omeprazole (PRILOSEC) 40 MG capsule, Take 1 capsule (40 mg total) by mouth daily., Disp: 90 capsule, Rfl: 3 .  venlafaxine (EFFEXOR) 75 MG tablet, Take 1 tablet (75 mg total) by mouth 2 (two) times daily., Disp: 180 tablet, Rfl: 3 .  albuterol (PROVENTIL HFA;VENTOLIN HFA) 108 (90 Base) MCG/ACT inhaler, Inhale 2 puffs into the lungs every 6 (six) hours as needed for wheezing or shortness of breath. (Patient not taking: Reported on 07/14/2016), Disp: 1 Inhaler, Rfl: 0 .  ferrous sulfate 325 (65 FE) MG tablet, Take by mouth as needed. Reported on 03/06/2015, Disp: , Rfl:  .  Fluticasone-Salmeterol (ADVAIR) 100-50 MCG/DOSE AEPB, Inhale 1 puff into the lungs 2 (two) times daily. (Patient not taking: Reported on 07/14/2016), Disp: 1 each, Rfl: 3 .  sucralfate  (CARAFATE) 1 g tablet, Take 1 g by mouth 4 (four) times daily -  with meals and at bedtime., Disp: , Rfl:   Review of Systems  Constitutional: Negative.   HENT: Negative.   Eyes: Negative.   Respiratory: Negative.   Cardiovascular: Negative.   Gastrointestinal: Negative.   Endocrine: Negative.   Genitourinary: Negative.   Musculoskeletal: Negative.   Skin: Negative.   Allergic/Immunologic: Negative.   Neurological: Negative.   Hematological: Negative.   Psychiatric/Behavioral: Negative.     Social History  Substance Use Topics  . Smoking status: Former Smoker    Quit date: 03/03/1983  . Smokeless tobacco: Former Systems developer     Comment: quit smoking in 1985  . Alcohol use 1.2 oz/week    2 Glasses of wine per week     Comment: 2-3 drinks per week   Objective:   BP 102/60 (BP Location: Left Arm, Patient Position: Sitting, Cuff Size: Large)   Pulse 86   Temp 97.6 F (36.4 C) (Oral)   Resp 16   Wt 192 lb (87.1 kg)   BMI 32.96 kg/m  Vitals:   11/17/16 1105  BP: 102/60  Pulse: 86  Resp: 16  Temp: 97.6 F (36.4 C)  TempSrc: Oral  Weight: 192 lb (87.1 kg)     Physical Exam  Constitutional: She is oriented to person, place,  and time. She appears well-developed and well-nourished.  HENT:  Head: Normocephalic and atraumatic.  Eyes: Pupils are equal, round, and reactive to light. Conjunctivae and EOM are normal. No scleral icterus.  Neck: Normal range of motion. Neck supple. No thyromegaly present.  Cardiovascular: Normal rate, regular rhythm, normal heart sounds and intact distal pulses.   Pulmonary/Chest: Effort normal and breath sounds normal.  Abdominal: Soft.  Musculoskeletal: Normal range of motion.  Neurological: She is alert and oriented to person, place, and time. She has normal reflexes.  Skin: Skin is warm and dry.  Psychiatric: She has a normal mood and affect. Her behavior is normal. Judgment and thought content normal.        Assessment & Plan:     1.  Anemia, unspecified type  - CBC with Differential/Platelet - Iron  2. Acquired hypothyroidism  - TSH  3. Need for influenza vaccination Pt will get flu vaccine at pharmacy, it is cheaper for her. 4.Chronic Anxiety  5.Dystonia Helped by speech Therapy.    HPI, Exam, and A&P Transcribed under the direction and in the presence of Navayah Sok L. Cranford Mon, MD  Electronically Signed: Katina Dung, CMA  I have done the exam and reviewed the above chart and it is accurate to the best of my knowledge. Development worker, community has been used in this note in any air is in the dictation or transcription are unintentional.  [name], MD  [place]

## 2016-11-18 ENCOUNTER — Telehealth: Payer: Self-pay

## 2016-11-18 NOTE — Telephone Encounter (Signed)
-----   Message from Jerrol Banana., MD sent at 11/18/2016 11:04 AM EDT ----- CBC okay. Decrease Synthroid from 175-150 g daily

## 2016-11-18 NOTE — Telephone Encounter (Signed)
NA. sd 

## 2016-11-19 DIAGNOSIS — M62830 Muscle spasm of back: Secondary | ICD-10-CM | POA: Diagnosis not present

## 2016-11-19 DIAGNOSIS — M9903 Segmental and somatic dysfunction of lumbar region: Secondary | ICD-10-CM | POA: Diagnosis not present

## 2016-11-19 DIAGNOSIS — M5136 Other intervertebral disc degeneration, lumbar region: Secondary | ICD-10-CM | POA: Diagnosis not present

## 2016-11-19 DIAGNOSIS — M9902 Segmental and somatic dysfunction of thoracic region: Secondary | ICD-10-CM | POA: Diagnosis not present

## 2016-11-20 NOTE — Telephone Encounter (Signed)
No answer-Anastasiya V Hopkins, RMA

## 2016-11-23 MED ORDER — LEVOTHYROXINE SODIUM 150 MCG PO TABS: 150.0000 ug | ORAL_TABLET | Freq: Every day | ORAL | 3 refills | Status: DC

## 2016-11-23 NOTE — Telephone Encounter (Signed)
Patient advised. RX sent in to [facility], [place]

## 2016-12-14 DIAGNOSIS — Z23 Encounter for immunization: Secondary | ICD-10-CM | POA: Diagnosis not present

## 2017-01-25 ENCOUNTER — Ambulatory Visit: Payer: Self-pay | Admitting: Family Medicine

## 2017-01-25 ENCOUNTER — Ambulatory Visit (INDEPENDENT_AMBULATORY_CARE_PROVIDER_SITE_OTHER): Payer: Medicare Other | Admitting: Family Medicine

## 2017-01-25 VITALS — BP 114/62 | HR 94 | Temp 97.8°F | Resp 18 | Wt 195.0 lb

## 2017-01-25 DIAGNOSIS — R05 Cough: Secondary | ICD-10-CM | POA: Diagnosis not present

## 2017-01-25 DIAGNOSIS — J40 Bronchitis, not specified as acute or chronic: Secondary | ICD-10-CM

## 2017-01-25 DIAGNOSIS — R059 Cough, unspecified: Secondary | ICD-10-CM

## 2017-01-25 MED ORDER — HYDROCODONE-HOMATROPINE 5-1.5 MG/5ML PO SYRP: 5.0000 mL | ORAL_SOLUTION | Freq: Four times a day (QID) | ORAL | 0 refills | Status: DC | PRN

## 2017-01-25 MED ORDER — AZITHROMYCIN 250 MG PO TABS: ORAL_TABLET | ORAL | 0 refills | Status: DC

## 2017-01-25 NOTE — Progress Notes (Signed)
Patient: Jasmine Rich Female    DOB: 1945/04/02   71 y.o.   MRN: [id number] Visit Date: 01/25/2017  Today's Provider: [name], MD   Chief Complaint  Patient presents with  . URI   Subjective:    HPI Pt reports that she has had a cough for about 2 week. The cough is non productive. She has tried mucinex but does not seem to help get the mucus up. The cough is not keeping her up at night.    No fevers or hemoptysis.    Allergies  Allergen Reactions  . Codeine Other (See Comments)    Abdominal pain and nausea     Current Outpatient Medications:  .  albuterol (PROVENTIL HFA;VENTOLIN HFA) 108 (90 Base) MCG/ACT inhaler, Inhale 2 puffs into the lungs every 6 (six) hours as needed for wheezing or shortness of breath. (Patient not taking: Reported on 07/14/2016), Disp: 1 Inhaler, Rfl: 0 .  bisoprolol-hydrochlorothiazide (ZIAC) 5-6.25 MG tablet, Take 1 tablet by mouth daily., Disp: 90 tablet, Rfl: 3 .  ferrous sulfate 325 (65 FE) MG tablet, Take by mouth as needed. Reported on 03/06/2015, Disp: , Rfl:  .  Fluticasone-Salmeterol (ADVAIR) 100-50 MCG/DOSE AEPB, Inhale 1 puff into the lungs 2 (two) times daily. (Patient not taking: Reported on 07/14/2016), Disp: 1 each, Rfl: 3 .  levothyroxine (SYNTHROID, LEVOTHROID) 150 MCG tablet, Take 1 tablet (150 mcg total) by mouth daily before breakfast., Disp: 90 tablet, Rfl: 3 .  LORazepam (ATIVAN) 0.5 MG tablet, Take 1 tablet (0.5 mg total) by mouth at bedtime., Disp: 90 tablet, Rfl: 1 .  mirtazapine (REMERON) 30 MG tablet, Take 1 tablet (30 mg total) by mouth at bedtime., Disp: 90 tablet, Rfl: 3 .  naproxen (NAPROSYN) 500 MG tablet, Take 1 tablet (500 mg total) by mouth 2 (two) times daily with a meal., Disp: 180 tablet, Rfl: 3 .  omeprazole (PRILOSEC) 40 MG capsule, Take 1 capsule (40 mg total) by mouth daily., Disp: 90 capsule, Rfl: 3 .  sucralfate (CARAFATE) 1 g tablet, Take 1 g by mouth 4 (four) times daily -  with meals and at  bedtime., Disp: , Rfl:  .  venlafaxine (EFFEXOR) 75 MG tablet, Take 1 tablet (75 mg total) by mouth 2 (two) times daily., Disp: 180 tablet, Rfl: 3  Review of Systems  Constitutional: Negative.   HENT: Positive for congestion, postnasal drip, rhinorrhea and sore throat.   Eyes: Negative.   Respiratory: Positive for cough, chest tightness and shortness of breath.   Cardiovascular: Negative.   Endocrine: Negative.   Genitourinary: Negative.   Musculoskeletal: Negative.   Skin: Negative.   Neurological: Negative.   Hematological: Negative.   Psychiatric/Behavioral: Negative.     Social History   Tobacco Use  . Smoking status: Former Smoker    Last attempt to quit: 03/03/1983    Years since quitting: 33.9  . Smokeless tobacco: Former Systems developer  . Tobacco comment: quit smoking in 1985  Substance Use Topics  . Alcohol use: Yes    Alcohol/week: 1.2 oz    Types: 2 Glasses of wine per week    Comment: 2-3 drinks per week   Objective:   BP 114/62 (BP Location: Left Arm, Patient Position: Sitting, Cuff Size: Large)   Pulse 94   Temp 97.8 F (36.6 C) (Oral)   Resp 18   Wt 195 lb (88.5 kg)   SpO2 98%   BMI 33.47 kg/m  Vitals:   01/25/17  1340  BP: 114/62  Pulse: 94  Resp: 18  Temp: 97.8 F (36.6 C)  TempSrc: Oral  SpO2: 98%  Weight: 195 lb (88.5 kg)     Physical Exam  Constitutional: She is oriented to person, place, and time. She appears well-developed and well-nourished.  HENT:  Head: Normocephalic and atraumatic.  Eyes: Conjunctivae are normal. No scleral icterus.  Neck: No thyromegaly present.  Cardiovascular: Normal rate, regular rhythm and normal heart sounds.  Pulmonary/Chest: Effort normal and breath sounds normal.  Abdominal: Soft.  Neurological: She is alert and oriented to person, place, and time.  Skin: Skin is warm and dry.  Psychiatric: She has a normal mood and affect. Her behavior is normal. Judgment and thought content normal.        Assessment &  Plan:     1. Bronchitis Start Zpak and Hycodan syrup for the cough. 2.Cough      I have done the exam and reviewed the above chart and it is accurate to the best of my knowledge. Development worker, community has been used in this note in any air is in the dictation or transcription are unintentional.  [name], MD  [place]

## 2017-02-09 ENCOUNTER — Encounter: Payer: Self-pay | Admitting: Family Medicine

## 2017-02-11 ENCOUNTER — Other Ambulatory Visit: Payer: Self-pay | Admitting: Family Medicine

## 2017-02-11 DIAGNOSIS — D225 Melanocytic nevi of trunk: Secondary | ICD-10-CM | POA: Diagnosis not present

## 2017-02-11 DIAGNOSIS — L570 Actinic keratosis: Secondary | ICD-10-CM | POA: Diagnosis not present

## 2017-02-11 DIAGNOSIS — D2272 Melanocytic nevi of left lower limb, including hip: Secondary | ICD-10-CM | POA: Diagnosis not present

## 2017-02-11 DIAGNOSIS — D2271 Melanocytic nevi of right lower limb, including hip: Secondary | ICD-10-CM | POA: Diagnosis not present

## 2017-02-11 DIAGNOSIS — I10 Essential (primary) hypertension: Secondary | ICD-10-CM

## 2017-02-11 DIAGNOSIS — D2261 Melanocytic nevi of right upper limb, including shoulder: Secondary | ICD-10-CM | POA: Diagnosis not present

## 2017-02-11 DIAGNOSIS — X32XXXA Exposure to sunlight, initial encounter: Secondary | ICD-10-CM | POA: Diagnosis not present

## 2017-02-11 NOTE — Telephone Encounter (Signed)
Please review. Thanks!  

## 2017-02-11 NOTE — Telephone Encounter (Signed)
Pt contacted office for refill request on the following medications:  1. bisoprolol-hydrochlorothiazide (ZIAC) 5-6.25 MG tablet 2. LORazepam (ATIVAN) 0.5 MG tablet 3. mirtazapine (REMERON) 30 MG tablet 4. naproxen (NAPROSYN) 500 MG tablet 5. omeprazole (PRILOSEC) 40 MG capsule 6. venlafaxine (EFFEXOR) 75 MG tablet  [place] stated at her LOV on 01/25/17 the Rx were supposed to be sent in but they weren't. Please advise. Thanks TNP

## 2017-02-15 DIAGNOSIS — M9903 Segmental and somatic dysfunction of lumbar region: Secondary | ICD-10-CM | POA: Diagnosis not present

## 2017-02-15 DIAGNOSIS — M9902 Segmental and somatic dysfunction of thoracic region: Secondary | ICD-10-CM | POA: Diagnosis not present

## 2017-02-15 DIAGNOSIS — M5136 Other intervertebral disc degeneration, lumbar region: Secondary | ICD-10-CM | POA: Diagnosis not present

## 2017-02-15 DIAGNOSIS — M62830 Muscle spasm of back: Secondary | ICD-10-CM | POA: Diagnosis not present

## 2017-02-15 MED ORDER — LORAZEPAM 0.5 MG PO TABS: 0.5000 mg | ORAL_TABLET | Freq: Every day | ORAL | 1 refills | Status: DC

## 2017-02-15 MED ORDER — MIRTAZAPINE 30 MG PO TABS: 30.0000 mg | ORAL_TABLET | Freq: Every day | ORAL | 3 refills | Status: DC

## 2017-02-15 MED ORDER — VENLAFAXINE HCL 75 MG PO TABS: 75.0000 mg | ORAL_TABLET | Freq: Two times a day (BID) | ORAL | 3 refills | Status: DC

## 2017-02-15 MED ORDER — BISOPROLOL-HYDROCHLOROTHIAZIDE 5-6.25 MG PO TABS: 1.0000 | ORAL_TABLET | Freq: Every day | ORAL | 3 refills | Status: DC

## 2017-02-15 MED ORDER — NAPROXEN 500 MG PO TABS: 500.0000 mg | ORAL_TABLET | Freq: Two times a day (BID) | ORAL | 3 refills | Status: DC

## 2017-02-15 MED ORDER — OMEPRAZOLE 40 MG PO CPDR: 40.0000 mg | DELAYED_RELEASE_CAPSULE | Freq: Every day | ORAL | 3 refills | Status: DC

## 2017-03-16 DIAGNOSIS — M9903 Segmental and somatic dysfunction of lumbar region: Secondary | ICD-10-CM | POA: Diagnosis not present

## 2017-03-16 DIAGNOSIS — M9902 Segmental and somatic dysfunction of thoracic region: Secondary | ICD-10-CM | POA: Diagnosis not present

## 2017-03-16 DIAGNOSIS — M62830 Muscle spasm of back: Secondary | ICD-10-CM | POA: Diagnosis not present

## 2017-03-16 DIAGNOSIS — M5136 Other intervertebral disc degeneration, lumbar region: Secondary | ICD-10-CM | POA: Diagnosis not present

## 2017-05-17 ENCOUNTER — Encounter: Payer: Self-pay | Admitting: Family Medicine

## 2017-05-17 ENCOUNTER — Ambulatory Visit (INDEPENDENT_AMBULATORY_CARE_PROVIDER_SITE_OTHER): Payer: Medicare Other | Admitting: Family Medicine

## 2017-05-17 VITALS — BP 120/80 | HR 88 | Temp 98.1°F | Resp 16

## 2017-05-17 DIAGNOSIS — D649 Anemia, unspecified: Secondary | ICD-10-CM

## 2017-05-17 DIAGNOSIS — M1990 Unspecified osteoarthritis, unspecified site: Secondary | ICD-10-CM | POA: Diagnosis not present

## 2017-05-17 DIAGNOSIS — E039 Hypothyroidism, unspecified: Secondary | ICD-10-CM | POA: Diagnosis not present

## 2017-05-17 NOTE — Progress Notes (Signed)
Patient: Jasmine Rich Female    DOB: [birth year]   72 y.o.   MRN: [id number] Visit Date: 05/17/2017  Today's Provider: [name], MD   Chief Complaint  Patient presents with  . Follow-up    TSH,Anemia   Subjective:    HPI Patient here today to follow-up on her Thyroid. Synthroid was decrease from 175-150 mcg.  Hypothyroid, follow-up:  TSH  Date Value Ref Range Status  11/17/2016 0.24 (L) 0.40 - 4.50 mIU/L Final  07/15/2016 0.023 (L) 0.450 - 4.500 uIU/mL Final  01/13/2016 4.270 0.450 - 4.500 uIU/mL Final   She was last seen for hypothyroid 6 months ago.  Management since that visit includes Decrease Synthroid from 175-150 g daily She reports excellent compliance with treatment. She is not having side effects.  She is not exercising. She is experiencing fatigue She denies  diarrhea, heat / cold intolerance, nervousness, palpitations and weight changes   ------------------------------------------------------------------------ Anemia:  Patient is here to follow-up anemia. Only complain:fatigue. She reports she is not taking the iron medication. Lab Results  Component Value Date   WBC 7.0 11/17/2016   HGB 11.1 (L) 11/17/2016   HCT 34.0 (L) 11/17/2016   PLT 351 11/17/2016   GLUCOSE 114 (H) 07/15/2016   CHOL 186 07/15/2016   TRIG 251 (H) 07/15/2016   HDL 30 (L) 07/15/2016   LDLCALC 106 (H) 07/15/2016   ALT 12 07/15/2016   AST 19 07/15/2016   NA 141 07/15/2016   K 4.7 07/15/2016   CL 102 07/15/2016   CREATININE 0.97 07/15/2016   BUN 14 07/15/2016   CO2 24 07/15/2016   TSH 0.24 (L) 11/17/2016      Allergies  Allergen Reactions  . Codeine Other (See Comments)    Abdominal pain and nausea     Current Outpatient Medications:  .  bisoprolol-hydrochlorothiazide (ZIAC) 5-6.25 MG tablet, Take 1 tablet by mouth daily., Disp: 90 tablet, Rfl: 3 .  levothyroxine (SYNTHROID, LEVOTHROID) 150 MCG tablet, Take 1 tablet (150 mcg total) by mouth daily before  breakfast., Disp: 90 tablet, Rfl: 3 .  LORazepam (ATIVAN) 0.5 MG tablet, Take 1 tablet (0.5 mg total) by mouth at bedtime., Disp: 90 tablet, Rfl: 1 .  mirtazapine (REMERON) 30 MG tablet, Take 1 tablet (30 mg total) by mouth at bedtime., Disp: 90 tablet, Rfl: 3 .  naproxen (NAPROSYN) 500 MG tablet, Take 1 tablet (500 mg total) by mouth 2 (two) times daily with a meal., Disp: 180 tablet, Rfl: 3 .  omeprazole (PRILOSEC) 40 MG capsule, Take 1 capsule (40 mg total) by mouth daily., Disp: 90 capsule, Rfl: 3 .  TURMERIC PO, Take by mouth., Disp: , Rfl:  .  venlafaxine (EFFEXOR) 75 MG tablet, Take 1 tablet (75 mg total) by mouth 2 (two) times daily., Disp: 180 tablet, Rfl: 3 .  albuterol (PROVENTIL HFA;VENTOLIN HFA) 108 (90 Base) MCG/ACT inhaler, Inhale 2 puffs into the lungs every 6 (six) hours as needed for wheezing or shortness of breath. (Patient not taking: Reported on 07/14/2016), Disp: 1 Inhaler, Rfl: 0 .  azithromycin (ZITHROMAX) 250 MG tablet, As directed (Patient not taking: Reported on 05/17/2017), Disp: 6 tablet, Rfl: 0 .  ferrous sulfate 325 (65 FE) MG tablet, Take by mouth as needed. Reported on 03/06/2015, Disp: , Rfl:  .  Fluticasone-Salmeterol (ADVAIR) 100-50 MCG/DOSE AEPB, Inhale 1 puff into the lungs 2 (two) times daily. (Patient not taking: Reported on 07/14/2016), Disp: 1 each, Rfl: 3 .  HYDROcodone-homatropine (  HYCODAN) 5-1.5 MG/5ML syrup, Take 5 mLs by mouth every 6 (six) hours as needed for cough. (Patient not taking: Reported on 05/17/2017), Disp: 120 mL, Rfl: 0 .  sucralfate (CARAFATE) 1 g tablet, Take 1 g by mouth 4 (four) times daily -  with meals and at bedtime., Disp: , Rfl:   Review of Systems  Constitutional: Positive for fatigue.  Eyes: Negative for visual disturbance.  Cardiovascular: Negative for chest pain, palpitations and leg swelling.  Gastrointestinal: Negative for diarrhea, nausea and vomiting.  Musculoskeletal: Positive for arthralgias and myalgias.  Neurological:  Negative for dizziness, light-headedness and headaches.    Social History   Tobacco Use  . Smoking status: Former Smoker    Last attempt to quit: 03/03/1983    Years since quitting: 34.2  . Smokeless tobacco: Former Systems developer  . Tobacco comment: quit smoking in 1985  Substance Use Topics  . Alcohol use: Yes    Alcohol/week: 1.2 oz    Types: 2 Glasses of wine per week    Comment: 2-3 drinks per week   Objective:   BP 120/80 (BP Location: Right Arm, Patient Position: Sitting, Cuff Size: Normal)   Pulse 88   Temp 98.1 F (36.7 C) (Oral)   Resp 16   SpO2 97%    Physical Exam  Constitutional: She is oriented to person, place, and time. She appears well-developed and well-nourished.  HENT:  Head: Normocephalic and atraumatic.  Right Ear: External ear normal.  Left Ear: External ear normal.  Nose: Nose normal.  Eyes: Conjunctivae are normal. No scleral icterus.  Neck: No thyromegaly present.  Cardiovascular: Normal rate, regular rhythm and normal heart sounds.  Pulmonary/Chest: Effort normal and breath sounds normal.  Abdominal: Soft.  Musculoskeletal: She exhibits no edema.  Neurological: She is alert and oriented to person, place, and time.  Skin: Skin is warm and dry.  Psychiatric: She has a normal mood and affect. Her behavior is normal. Judgment and thought content normal.        Assessment & Plan:  1. Acquired hypothyroidism: - TSH  2. Anemia, unspecified type This has been almost a year. Refer to GI and then hematology if still present--slow GI bleed? - CBC with Differential/Platelet - Iron  3. Arthritis -OTC Turmeric  I have done the exam and reviewed the above chart and it is accurate to the best of my knowledge. Development worker, community has been used in this note in any air is in the dictation or transcription are unintentional.  [name], MD  [place]

## 2017-05-18 DIAGNOSIS — M5136 Other intervertebral disc degeneration, lumbar region: Secondary | ICD-10-CM | POA: Diagnosis not present

## 2017-05-18 DIAGNOSIS — M9903 Segmental and somatic dysfunction of lumbar region: Secondary | ICD-10-CM | POA: Diagnosis not present

## 2017-05-18 DIAGNOSIS — M9902 Segmental and somatic dysfunction of thoracic region: Secondary | ICD-10-CM | POA: Diagnosis not present

## 2017-05-18 DIAGNOSIS — M62830 Muscle spasm of back: Secondary | ICD-10-CM | POA: Diagnosis not present

## 2017-05-18 LAB — CBC WITH DIFFERENTIAL/PLATELET
BASOS ABS: 0 10*3/uL (ref 0.0–0.2)
BASOS: 0 %
EOS (ABSOLUTE): 0 10*3/uL (ref 0.0–0.4)
Eos: 0 %
Hematocrit: 32.6 % — ABNORMAL LOW (ref 34.0–46.6)
Hemoglobin: 10.1 g/dL — ABNORMAL LOW (ref 11.1–15.9)
IMMATURE GRANS (ABS): 0 10*3/uL (ref 0.0–0.1)
IMMATURE GRANULOCYTES: 0 %
Lymphocytes Absolute: 2.1 10*3/uL (ref 0.7–3.1)
Lymphs: 26 %
MCH: 24 pg — ABNORMAL LOW (ref 26.6–33.0)
MCHC: 31 g/dL — ABNORMAL LOW (ref 31.5–35.7)
MCV: 78 fL — AB (ref 79–97)
MONOS ABS: 1.2 10*3/uL — AB (ref 0.1–0.9)
Monocytes: 15 %
NEUTROS PCT: 59 %
Neutrophils Absolute: 4.7 10*3/uL (ref 1.4–7.0)
PLATELETS: 464 10*3/uL — AB (ref 150–379)
RBC: 4.2 x10E6/uL (ref 3.77–5.28)
RDW: 16.2 % — AB (ref 12.3–15.4)
WBC: 8.1 10*3/uL (ref 3.4–10.8)

## 2017-05-18 LAB — IRON: Iron: 28 ug/dL (ref 27–139)

## 2017-05-18 LAB — TSH: TSH: 0.282 u[IU]/mL — AB (ref 0.450–4.500)

## 2017-05-24 ENCOUNTER — Telehealth: Payer: Self-pay | Admitting: Emergency Medicine

## 2017-05-24 DIAGNOSIS — D509 Iron deficiency anemia, unspecified: Secondary | ICD-10-CM

## 2017-05-24 DIAGNOSIS — E039 Hypothyroidism, unspecified: Secondary | ICD-10-CM

## 2017-05-24 MED ORDER — LEVOTHYROXINE SODIUM 125 MCG PO TABS: 125.0000 ug | ORAL_TABLET | Freq: Every day | ORAL | 3 refills | Status: DC

## 2017-05-24 NOTE — Telephone Encounter (Signed)
-----   Message from Jerrol Banana., MD sent at 05/24/2017  8:35 AM EDT ----- Too much thyroid--decrease synthroid from 150 to 138mcg daily. Also refer to GI for iron def anemia--start Iron daily if not taking. RTC 1 month forn CBC./iron.

## 2017-05-24 NOTE — Telephone Encounter (Signed)
Pt advised. Orders placed. Med sent in.

## 2017-06-01 ENCOUNTER — Ambulatory Visit (INDEPENDENT_AMBULATORY_CARE_PROVIDER_SITE_OTHER): Payer: Medicare Other | Admitting: Physician Assistant

## 2017-06-01 ENCOUNTER — Encounter: Payer: Self-pay | Admitting: Physician Assistant

## 2017-06-01 VITALS — BP 126/82 | HR 90 | Temp 98.0°F | Resp 20 | Ht 63.0 in | Wt 191.0 lb

## 2017-06-01 DIAGNOSIS — J40 Bronchitis, not specified as acute or chronic: Secondary | ICD-10-CM | POA: Diagnosis not present

## 2017-06-01 MED ORDER — ALBUTEROL SULFATE HFA 108 (90 BASE) MCG/ACT IN AERS: 2.0000 | INHALATION_SPRAY | Freq: Four times a day (QID) | RESPIRATORY_TRACT | 2 refills | Status: DC | PRN

## 2017-06-01 MED ORDER — FLUTICASONE-SALMETEROL 250-50 MCG/DOSE IN AEPB: 1.0000 | INHALATION_SPRAY | Freq: Two times a day (BID) | RESPIRATORY_TRACT | 3 refills | Status: DC

## 2017-06-01 MED ORDER — PREDNISONE 10 MG PO TABS: 10.0000 mg | ORAL_TABLET | Freq: Two times a day (BID) | ORAL | 0 refills | Status: AC

## 2017-06-01 MED ORDER — DOXYCYCLINE HYCLATE 100 MG PO TABS: 100.0000 mg | ORAL_TABLET | Freq: Two times a day (BID) | ORAL | 0 refills | Status: AC

## 2017-06-01 NOTE — Patient Instructions (Signed)

## 2017-06-01 NOTE — Progress Notes (Signed)
Patient: Jasmine Rich Female    DOB: Jun 08, 1945   72 y.o.   MRN: [id number] Visit Date: 06/01/2017  Today's Provider: Trinna Post, PA-C   Chief Complaint  Patient presents with  . Cough   Subjective:    Jasmine Rich is a 72 y/o woman with history of tobacco abuse presenting as below.  Cough  This is a new problem. The current episode started in the past 7 days (about 4 days). The problem has been gradually worsening. The problem occurs constantly. The cough is productive of sputum. Associated symptoms include headaches and a sore throat. Nothing aggravates the symptoms. She has tried rest (also has taken mucinex) for the symptoms. The treatment provided mild relief. Her past medical history is significant for environmental allergies.       Allergies  Allergen Reactions  . Codeine Other (See Comments)    Abdominal pain and nausea     Current Outpatient Medications:  .  bisoprolol-hydrochlorothiazide (ZIAC) 5-6.25 MG tablet, Take 1 tablet by mouth daily., Disp: 90 tablet, Rfl: 3 .  ferrous sulfate 325 (65 FE) MG tablet, Take by mouth as needed. Reported on 03/06/2015, Disp: , Rfl:  .  levothyroxine (SYNTHROID, LEVOTHROID) 125 MCG tablet, Take 1 tablet (125 mcg total) by mouth daily before breakfast., Disp: 90 tablet, Rfl: 3 .  LORazepam (ATIVAN) 0.5 MG tablet, Take 1 tablet (0.5 mg total) by mouth at bedtime., Disp: 90 tablet, Rfl: 1 .  mirtazapine (REMERON) 30 MG tablet, Take 1 tablet (30 mg total) by mouth at bedtime., Disp: 90 tablet, Rfl: 3 .  naproxen (NAPROSYN) 500 MG tablet, Take 1 tablet (500 mg total) by mouth 2 (two) times daily with a meal., Disp: 180 tablet, Rfl: 3 .  omeprazole (PRILOSEC) 40 MG capsule, Take 1 capsule (40 mg total) by mouth daily., Disp: 90 capsule, Rfl: 3 .  sucralfate (CARAFATE) 1 g tablet, Take 1 g by mouth 4 (four) times daily -  with meals and at bedtime., Disp: , Rfl:  .  TURMERIC PO, Take by mouth., Disp: , Rfl:  .  venlafaxine  (EFFEXOR) 75 MG tablet, Take 1 tablet (75 mg total) by mouth 2 (two) times daily., Disp: 180 tablet, Rfl: 3 .  albuterol (PROVENTIL HFA;VENTOLIN HFA) 108 (90 Base) MCG/ACT inhaler, Inhale 2 puffs into the lungs every 6 (six) hours as needed for wheezing or shortness of breath., Disp: 1 Inhaler, Rfl: 2 .  azithromycin (ZITHROMAX) 250 MG tablet, As directed (Patient not taking: Reported on 05/17/2017), Disp: 6 tablet, Rfl: 0 .  doxycycline (VIBRA-TABS) 100 MG tablet, Take 1 tablet (100 mg total) by mouth 2 (two) times daily for 7 days., Disp: 14 tablet, Rfl: 0 .  Fluticasone-Salmeterol (ADVAIR DISKUS) 250-50 MCG/DOSE AEPB, Inhale 1 puff into the lungs 2 (two) times daily., Disp: 1 each, Rfl: 3 .  HYDROcodone-homatropine (HYCODAN) 5-1.5 MG/5ML syrup, Take 5 mLs by mouth every 6 (six) hours as needed for cough. (Patient not taking: Reported on 05/17/2017), Disp: 120 mL, Rfl: 0 .  predniSONE (DELTASONE) 10 MG tablet, Take 1 tablet (10 mg total) by mouth 2 (two) times daily with a meal for 5 days., Disp: 10 tablet, Rfl: 0  Review of Systems  HENT: Positive for sore throat.   Respiratory: Positive for cough.   Allergic/Immunologic: Positive for environmental allergies.  Neurological: Positive for headaches.    Social History   Tobacco Use  . Smoking status: Former Smoker    Last  attempt to quit: 03/03/1983    Years since quitting: 34.2  . Smokeless tobacco: Former Systems developer  . Tobacco comment: quit smoking in 1985  Substance Use Topics  . Alcohol use: Yes    Alcohol/week: 1.2 oz    Types: 2 Glasses of wine per week    Comment: 2-3 drinks per week   Objective:   BP 126/82 (BP Location: Right Arm, Patient Position: Sitting, Cuff Size: Normal)   Pulse 90   Temp 98 F (36.7 C)   Resp 20   Ht 5\' 3"  (1.6 m)   Wt 191 lb (86.6 kg)   SpO2 98%   BMI 33.83 kg/m  Vitals:   06/01/17 1130  BP: 126/82  Pulse: 90  Resp: 20  Temp: 98 F (36.7 C)  SpO2: 98%  Weight: 191 lb (86.6 kg)  Height: 5\' 3"   (1.6 m)     Physical Exam  Constitutional: She is oriented to person, place, and time. She appears well-developed and well-nourished.  Eyes: Conjunctivae are normal.  Neck: Neck supple.  Cardiovascular: Normal rate and regular rhythm.  Pulmonary/Chest: She has wheezes.  Wheezes and rhonchi scattered in bilateral lung fields.  Lymphadenopathy:    She has no cervical adenopathy.  Neurological: She is alert and oriented to person, place, and time.  Skin: Skin is warm and dry.  Psychiatric: She has a normal mood and affect. Her behavior is normal.        Assessment & Plan:     1. Bronchitis  Will treat like COPD exacerbation.   - doxycycline (VIBRA-TABS) 100 MG tablet; Take 1 tablet (100 mg total) by mouth 2 (two) times daily for 7 days.  Dispense: 14 tablet; Refill: 0 - predniSONE (DELTASONE) 10 MG tablet; Take 1 tablet (10 mg total) by mouth 2 (two) times daily with a meal for 5 days.  Dispense: 10 tablet; Refill: 0 - Fluticasone-Salmeterol (ADVAIR DISKUS) 250-50 MCG/DOSE AEPB; Inhale 1 puff into the lungs 2 (two) times daily.  Dispense: 1 each; Refill: 3  Return if symptoms worsen or fail to improve.  The entirety of the information documented in the History of Present Illness, Review of Systems and Physical Exam were personally obtained by me. Portions of this information were initially documented by Wilburt Finlay, CMA and reviewed by me for thoroughness and accuracy.        Trinna Post, PA-C  [facility]

## 2017-07-06 DIAGNOSIS — M5136 Other intervertebral disc degeneration, lumbar region: Secondary | ICD-10-CM | POA: Diagnosis not present

## 2017-07-06 DIAGNOSIS — M9902 Segmental and somatic dysfunction of thoracic region: Secondary | ICD-10-CM | POA: Diagnosis not present

## 2017-07-06 DIAGNOSIS — M62830 Muscle spasm of back: Secondary | ICD-10-CM | POA: Diagnosis not present

## 2017-07-06 DIAGNOSIS — M9903 Segmental and somatic dysfunction of lumbar region: Secondary | ICD-10-CM | POA: Diagnosis not present

## 2017-07-15 ENCOUNTER — Ambulatory Visit (INDEPENDENT_AMBULATORY_CARE_PROVIDER_SITE_OTHER): Payer: Medicare Other

## 2017-07-15 VITALS — BP 112/66 | HR 85 | Temp 98.5°F | Ht 63.0 in | Wt 193.0 lb

## 2017-07-15 DIAGNOSIS — Z0000 Encounter for general adult medical examination without abnormal findings: Secondary | ICD-10-CM | POA: Diagnosis not present

## 2017-07-15 NOTE — Progress Notes (Signed)
Subjective:   Jasmine Rich is a 72 y.o. female who presents for Medicare Annual (Subsequent) preventive examination.  Review of Systems:  N/A  Cardiac Risk Factors include: advanced age (>61men, >63 women);obesity (BMI >30kg/m2);hypertension;dyslipidemia     Objective:     Vitals: BP 112/66 (BP Location: Right Arm)   Pulse 85   Temp 98.5 F (36.9 C) (Oral)   Ht 5\' 3"  (1.6 m)   Wt 193 lb (87.5 kg)   BMI 34.19 kg/m   Body mass index is 34.19 kg/m.  Advanced Directives 07/15/2017 07/14/2016 07/15/2015  Does Patient Have a Medical Advance Directive? No No No  Would patient like information on creating a medical advance directive? Yes (MAU/Ambulatory/Procedural Areas - Information given) Yes (MAU/Ambulatory/Procedural Areas - Information given) -    Tobacco Social History   Tobacco Use  Smoking Status Former Smoker  . Last attempt to quit: 03/03/1983  . Years since quitting: 34.3  Smokeless Tobacco Never Used  Tobacco Comment   quit smoking in 1985     Counseling given: Not Answered Comment: quit smoking in 1985   Clinical Intake:  Pre-visit preparation completed: Yes  Pain : No/denies pain Pain Score: 0-No pain     Nutritional Status: BMI > 30  Obese  How often do you need to have someone help you when you read instructions, pamphlets, or other written materials from your doctor or pharmacy?: 1 - Never  Interpreter Needed?: No  Information entered by :: [facility], LPN  Past Medical History:  Diagnosis Date  . Anemia   . Anxiety   . Depression   . GERD (gastroesophageal reflux disease)   . Hypertension   . Thyroid disease   . Tremor, unspecified    throat tremor- diagnosed at [place]   Past Surgical History:  Procedure Laterality Date  . ABDOMINAL HYSTERECTOMY  1983  . ABDOMINAL HYSTERECTOMY    . CATARACT EXTRACTION, BILATERAL  08/2015  . cataract removed    . FOOT SURGERY    . HAND SURGERY    . INNER EAR SURGERY    . LASIK  11/2015  .  MIDDLE EAR SURGERY    . THYROIDECTOMY  10/1988  . THYROIDECTOMY     Family History  Problem Relation Age of Onset  . Cancer Mother        Lung and brain cancer. Died from Non- Hodgkin's Lymphoma  . Lung cancer Mother   . Brain cancer Mother   . Hypothyroidism Son   . Stroke Father   . Hypertension Father   . Alcohol abuse Sister   . Diabetes Sister   . COPD Brother   . Prostate cancer Paternal Grandfather   . Alcohol abuse Brother   . Diabetes Brother   . Thyroid disease Son   . Arthritis Son   . Hashimoto's thyroiditis Son   . Breast cancer Maternal Grandmother    Social History   Socioeconomic History  . Marital status: Married    Spouse name: Not on file  . Number of children: 2  . Years of education: Not on file  . Highest education level: Some college, no degree  Occupational History  . Not on file  Social Needs  . Financial resource strain: Not hard at all  . Food insecurity:    Worry: Never true    Inability: Never true  . Transportation needs:    Medical: No    Non-medical: No  Tobacco Use  . Smoking status: Former Smoker  Last attempt to quit: 03/03/1983    Years since quitting: 34.3  . Smokeless tobacco: Never Used  . Tobacco comment: quit smoking in 1985  Substance and Sexual Activity  . Alcohol use: Yes    Alcohol/week: 1.8 oz    Types: 3 Glasses of wine per week  . Drug use: No  . Sexual activity: Not Currently    Birth control/protection: Surgical  Lifestyle  . Physical activity:    Days per week: Not on file    Minutes per session: Not on file  . Stress: To some extent  Relationships  . Social connections:    Talks on phone: Not on file    Gets together: Not on file    Attends religious service: Not on file    Active member of club or organization: Not on file    Attends meetings of clubs or organizations: Not on file    Relationship status: Not on file  Other Topics Concern  . Not on file  Social History Narrative   ** Merged  History Encounter **        Outpatient Encounter Medications as of 07/15/2017  Medication Sig  . albuterol (PROVENTIL HFA;VENTOLIN HFA) 108 (90 Base) MCG/ACT inhaler Inhale 2 puffs into the lungs every 6 (six) hours as needed for wheezing or shortness of breath.  . bisoprolol-hydrochlorothiazide (ZIAC) 5-6.25 MG tablet Take 1 tablet by mouth daily.  . ferrous sulfate 325 (65 FE) MG tablet Take by mouth as needed. Reported on 03/06/2015  . levothyroxine (SYNTHROID, LEVOTHROID) 125 MCG tablet Take 1 tablet (125 mcg total) by mouth daily before breakfast.  . LORazepam (ATIVAN) 0.5 MG tablet Take 1 tablet (0.5 mg total) by mouth at bedtime.  . mirtazapine (REMERON) 30 MG tablet Take 1 tablet (30 mg total) by mouth at bedtime.  . naproxen (NAPROSYN) 500 MG tablet Take 1 tablet (500 mg total) by mouth 2 (two) times daily with a meal.  . omeprazole (PRILOSEC) 40 MG capsule Take 1 capsule (40 mg total) by mouth daily.  Marland Kitchen venlafaxine (EFFEXOR) 75 MG tablet Take 1 tablet (75 mg total) by mouth 2 (two) times daily.  . Fluticasone-Salmeterol (ADVAIR DISKUS) 250-50 MCG/DOSE AEPB Inhale 1 puff into the lungs 2 (two) times daily. (Patient not taking: Reported on 07/15/2017)  . HYDROcodone-homatropine (HYCODAN) 5-1.5 MG/5ML syrup Take 5 mLs by mouth every 6 (six) hours as needed for cough. (Patient not taking: Reported on 05/17/2017)  . sucralfate (CARAFATE) 1 g tablet Take 1 g by mouth 4 (four) times daily -  with meals and at bedtime.  . TURMERIC PO Take by mouth.  . [DISCONTINUED] azithromycin (ZITHROMAX) 250 MG tablet As directed (Patient not taking: Reported on 05/17/2017)   No facility-administered encounter medications on file as of 07/15/2017.     Activities of Daily Living In your present state of health, do you have any difficulty performing the following activities: 07/15/2017  Hearing? Y  Comment Has hearing loss due to hx of surgeries on right ear.  Vision? N  Difficulty concentrating or making  decisions? N  Walking or climbing stairs? Y  Comment Due to previous respiratory issues.   Dressing or bathing? N  Doing errands, shopping? N  Preparing Food and eating ? N  Using the Toilet? N  In the past six months, have you accidently leaked urine? N  Do you have problems with loss of bowel control? N  Managing your Medications? N  Managing your Finances? N  Housekeeping or managing  your Housekeeping? N  Some recent data might be hidden    Patient Care Team: Jerrol Banana., MD as PCP - General (Family Medicine) Vevelyn Royals, MD as Consulting Physician (Ophthalmology) Defrancesco, Alanda Slim, MD as Consulting Physician (Obstetrics and Gynecology)    Assessment:   This is a routine wellness examination for [facility].  Exercise Activities and Dietary recommendations Current Exercise Habits: The patient does not participate in regular exercise at present, Exercise limited by: respiratory conditions(s)  Goals    . DIET - INCREASE WATER INTAKE     Recommend to cut back on caffeinated drinks and increase water intake to 4-6 glasses a day.        Fall Risk Fall Risk  07/14/2016 03/06/2015  Falls in the past year? No No   Is the patient's home free of loose throw rugs in walkways, pet beds, electrical cords, etc?   yes      Grab bars in the bathroom? no      Handrails on the stairs?   yes      Adequate lighting?   yes  Timed Get Up and Go performed: N/A  Depression Screen PHQ 2/9 Scores 07/15/2017 07/14/2016 07/14/2016 03/06/2015  PHQ - 2 Score 1 0 2 0  PHQ- 9 Score - - 6 -     Cognitive Function: Pt declined screening today.         Immunization History  Administered Date(s) Administered  . Influenza, High Dose Seasonal PF 12/14/2016  . Influenza-Unspecified 11/30/2012, 12/17/2014  . Pneumococcal Polysaccharide-23 10/27/2010, 10/27/2010  . Tdap 10/27/2010, 10/27/2010    Qualifies for Shingles Vaccine? Due for Shingles vaccine. Declined my offer to administer  today. Education has been provided regarding the importance of this vaccine. Pt has been advised to call her insurance company to determine her out of pocket expense. Advised she may also receive this vaccine at her local pharmacy or Health Dept. Verbalized acceptance and understanding.  Screening Tests Health Maintenance  Topic Date Due  . PNA vac Low Risk Adult (2 of 2 - PCV13) 10/27/2011  . MAMMOGRAM  04/16/2017  . INFLUENZA VACCINE  09/30/2017  . TETANUS/TDAP  10/26/2020  . COLONOSCOPY  08/03/2021  . DEXA SCAN  Completed  . Hepatitis C Screening  Completed    Cancer Screenings: Lung: Low Dose CT Chest recommended if Age 74-80 years, 30 pack-year currently smoking OR have quit w/in 15years. Patient does not qualify. Breast:  Up to date on Mammogram? No, currently due. Pt to set up a mammogram this year.  Up to date of Bone Density/Dexa? Yes Colorectal: Up to date  Additional Screenings:   Hepatitis C Screening: Up to date     Plan:  I have personally reviewed and addressed the Medicare Annual Wellness questionnaire and have noted the following in the patient's chart:  A. Medical and social history B. Use of alcohol, tobacco or illicit drugs  C. Current medications and supplements D. Functional ability and status E.  Nutritional status F.  Physical activity G. Advance directives H. List of other physicians I.  Hospitalizations, surgeries, and ER visits in previous 12 months J.  [place] such as hearing and vision if needed, cognitive and depression L. Referrals and appointments - none  In addition, I have reviewed and discussed with patient certain preventive protocols, quality metrics, and best practice recommendations. A written personalized care plan for preventive services as well as general preventive health recommendations were provided to patient.  See attached scanned  questionnaire for additional information.   Signed,  Fabio Neighbors, LPN Nurse  Health Advisor   Nurse Recommendations: Pt to set up a mammogram this year. Pt would like to f/u with PCP about this. Pt declined receiving this Prevnar 13 vaccine today.

## 2017-07-15 NOTE — Patient Instructions (Signed)
Jasmine Rich , Thank you for taking time to come for your Medicare Wellness Visit. I appreciate your ongoing commitment to your health goals. Please review the following plan we discussed and let me know if I can assist you in the future.   Screening recommendations/referrals: Colonoscopy: Up to date Mammogram: Pt to set up apt this year.  Bone Density: Up to date Recommended yearly ophthalmology/optometry visit for glaucoma screening and checkup Recommended yearly dental visit for hygiene and checkup  Vaccinations: Influenza vaccine: Up to date Pneumococcal vaccine: Pneumovax 23 up to date, pt declined the Prevnar 13 vaccine today.  Tdap vaccine: Up to date Shingles vaccine: Pt declines today.     Advanced directives: Advance directive discussed with you today. I have provided a copy for you to complete at home and have notarized. Once this is complete please bring a copy in to our office so we can scan it into your chart.  Conditions/risks identified: Obesity- recommend to cut back on caffeinated drinks and increase water intake to 4-6 glasses a day.   Next appointment: 11/17/17 @ 1:20 PM with Dr Rosanna Randy.    Preventive Care 56 Years and Older, Female Preventive care refers to lifestyle choices and visits with your health care provider that can promote health and wellness. What does preventive care include?  A yearly physical exam. This is also called an annual well check.  Dental exams once or twice a year.  Routine eye exams. Ask your health care provider how often you should have your eyes checked.  Personal lifestyle choices, including:  Daily care of your teeth and gums.  Regular physical activity.  Eating a healthy diet.  Avoiding tobacco and drug use.  Limiting alcohol use.  Practicing safe sex.  Taking low-dose aspirin every day.  Taking vitamin and mineral supplements as recommended by your health care provider. What happens during an annual well check? The  services and screenings done by your health care provider during your annual well check will depend on your age, overall health, lifestyle risk factors, and family history of disease. Counseling  Your health care provider may ask you questions about your:  Alcohol use.  Tobacco use.  Drug use.  Emotional well-being.  Home and relationship well-being.  Sexual activity.  Eating habits.  History of falls.  Memory and ability to understand (cognition).  Work and work Statistician.  Reproductive health. Screening  You may have the following tests or measurements:  Height, weight, and BMI.  Blood pressure.  Lipid and cholesterol levels. These may be checked every 5 years, or more frequently if you are over 10 years old.  Skin check.  Lung cancer screening. You may have this screening every year starting at age 99 if you have a 30-pack-year history of smoking and currently smoke or have quit within the past 15 years.  Fecal occult blood test (FOBT) of the stool. You may have this test every year starting at age 36.  Flexible sigmoidoscopy or colonoscopy. You may have a sigmoidoscopy every 5 years or a colonoscopy every 10 years starting at age 72.  Hepatitis C blood test.  Hepatitis B blood test.  Sexually transmitted disease (STD) testing.  Diabetes screening. This is done by checking your blood sugar (glucose) after you have not eaten for a while (fasting). You may have this done every 1-3 years.  Bone density scan. This is done to screen for osteoporosis. You may have this done starting at age 18.  Mammogram. This may be  done every 1-2 years. Talk to your health care provider about how often you should have regular mammograms. Talk with your health care provider about your test results, treatment options, and if necessary, the need for more tests. Vaccines  Your health care provider may recommend certain vaccines, such as:  Influenza vaccine. This is recommended  every year.  Tetanus, diphtheria, and acellular pertussis (Tdap, Td) vaccine. You may need a Td booster every 10 years.  Zoster vaccine. You may need this after age 24.  Pneumococcal 13-valent conjugate (PCV13) vaccine. One dose is recommended after age 28.  Pneumococcal polysaccharide (PPSV23) vaccine. One dose is recommended after age 23. Talk to your health care provider about which screenings and vaccines you need and how often you need them. This information is not intended to replace advice given to you by your health care provider. Make sure you discuss any questions you have with your health care provider. Document Released: 03/15/2015 Document Revised: 11/06/2015 Document Reviewed: 12/18/2014 Elsevier Interactive Patient Education  2017 [place] Prevention in the Home Falls can cause injuries. They can happen to people of all ages. There are many things you can do to make your home safe and to help prevent falls. What can I do on the outside of my home?  Regularly fix the edges of walkways and driveways and fix any cracks.  Remove anything that might make you trip as you walk through a door, such as a raised step or threshold.  Trim any bushes or trees on the path to your home.  Use bright outdoor lighting.  Clear any walking paths of anything that might make someone trip, such as rocks or tools.  Regularly check to see if handrails are loose or broken. Make sure that both sides of any steps have handrails.  Any raised decks and porches should have guardrails on the edges.  Have any leaves, snow, or ice cleared regularly.  Use sand or salt on walking paths during winter.  Clean up any spills in your garage right away. This includes oil or grease spills. What can I do in the bathroom?  Use night lights.  Install grab bars by the toilet and in the tub and shower. Do not use towel bars as grab bars.  Use non-skid mats or decals in the tub or shower.  If  you need to sit down in the shower, use a plastic, non-slip stool.  Keep the floor dry. Clean up any water that spills on the floor as soon as it happens.  Remove soap buildup in the tub or shower regularly.  Attach bath mats securely with double-sided non-slip rug tape.  Do not have throw rugs and other things on the floor that can make you trip. What can I do in the bedroom?  Use night lights.  Make sure that you have a light by your bed that is easy to reach.  Do not use any sheets or blankets that are too big for your bed. They should not hang down onto the floor.  Have a firm chair that has side arms. You can use this for support while you get dressed.  Do not have throw rugs and other things on the floor that can make you trip. What can I do in the kitchen?  Clean up any spills right away.  Avoid walking on wet floors.  Keep items that you use a lot in easy-to-reach places.  If you need to reach something above you, use  a strong step stool that has a grab bar.  Keep electrical cords out of the way.  Do not use floor polish or wax that makes floors slippery. If you must use wax, use non-skid floor wax.  Do not have throw rugs and other things on the floor that can make you trip. What can I do with my stairs?  Do not leave any items on the stairs.  Make sure that there are handrails on both sides of the stairs and use them. Fix handrails that are broken or loose. Make sure that handrails are as long as the stairways.  Check any carpeting to make sure that it is firmly attached to the stairs. Fix any carpet that is loose or worn.  Avoid having throw rugs at the top or bottom of the stairs. If you do have throw rugs, attach them to the floor with carpet tape.  Make sure that you have a light switch at the top of the stairs and the bottom of the stairs. If you do not have them, ask someone to add them for you. What else can I do to help prevent falls?  Wear shoes  that:  Do not have high heels.  Have rubber bottoms.  Are comfortable and fit you well.  Are closed at the toe. Do not wear sandals.  If you use a stepladder:  Make sure that it is fully opened. Do not climb a closed stepladder.  Make sure that both sides of the stepladder are locked into place.  Ask someone to hold it for you, if possible.  Clearly mark and make sure that you can see:  Any grab bars or handrails.  First and last steps.  Where the edge of each step is.  Use tools that help you move around (mobility aids) if they are needed. These include:  Canes.  Walkers.  Scooters.  Crutches.  Turn on the lights when you go into a dark area. Replace any light bulbs as soon as they burn out.  Set up your furniture so you have a clear path. Avoid moving your furniture around.  If any of your floors are uneven, fix them.  If there are any pets around you, be aware of where they are.  Review your medicines with your doctor. Some medicines can make you feel dizzy. This can increase your chance of falling. Ask your doctor what other things that you can do to help prevent falls. This information is not intended to replace advice given to you by your health care provider. Make sure you discuss any questions you have with your health care provider. Document Released: 12/13/2008 Document Revised: 07/25/2015 Document Reviewed: 03/23/2014 Elsevier Interactive Patient Education  2017 [employer].

## 2017-07-27 DIAGNOSIS — K219 Gastro-esophageal reflux disease without esophagitis: Secondary | ICD-10-CM | POA: Diagnosis not present

## 2017-07-27 DIAGNOSIS — D509 Iron deficiency anemia, unspecified: Secondary | ICD-10-CM | POA: Diagnosis not present

## 2017-07-27 DIAGNOSIS — K449 Diaphragmatic hernia without obstruction or gangrene: Secondary | ICD-10-CM | POA: Diagnosis not present

## 2017-07-28 DIAGNOSIS — K219 Gastro-esophageal reflux disease without esophagitis: Secondary | ICD-10-CM | POA: Insufficient documentation

## 2017-08-03 DIAGNOSIS — D509 Iron deficiency anemia, unspecified: Secondary | ICD-10-CM | POA: Diagnosis not present

## 2017-08-12 ENCOUNTER — Encounter: Payer: Self-pay | Admitting: Family Medicine

## 2017-08-12 NOTE — Progress Notes (Deleted)
Patient: Jasmine Rich, Female    DOB: 1945-04-13, 72 y.o.   MRN: [id number] Visit Date: 08/12/2017  Today's Provider: Lavon Paganini, MD   No chief complaint on file.  Subjective:     Complete Physical  Jasmine Rich is a 72 y.o. female. She feels {DESC; WELL/FAIRLY WELL/POORLY:18703}. She reports exercising ***. She reports she is sleeping {DESC; WELL/FAIRLY WELL/POORLY:18703}.  Last [facility]- 07/15/2017 Last colonoscopy- 08/04/2011- internal hemorrhoids, diverticulosis. Dr. Vira Agar. Repeat 10 years Last mammogram- UL Left- 08/28/2015- BI-RADS 1  -----------------------------------------------------------   Review of Systems  Social History   Socioeconomic History  . Marital status: Married    Spouse name: Not on file  . Number of children: 2  . Years of education: Not on file  . Highest education level: Some college, no degree  Occupational History  . Not on file  Social Needs  . Financial resource strain: Not hard at all  . Food insecurity:    Worry: Never true    Inability: Never true  . Transportation needs:    Medical: No    Non-medical: No  Tobacco Use  . Smoking status: Former Smoker    Last attempt to quit: 03/03/1983    Years since quitting: 34.4  . Smokeless tobacco: Never Used  . Tobacco comment: quit smoking in 1985  Substance and Sexual Activity  . Alcohol use: Yes    Alcohol/week: 1.8 oz    Types: 3 Glasses of wine per week  . Drug use: No  . Sexual activity: Not Currently    Birth control/protection: Surgical  Lifestyle  . Physical activity:    Days per week: Not on file    Minutes per session: Not on file  . Stress: To some extent  Relationships  . Social connections:    Talks on phone: Not on file    Gets together: Not on file    Attends religious service: Not on file    Active member of club or organization: Not on file    Attends meetings of clubs or organizations: Not on file    Relationship status: Not on file  . Intimate  partner violence:    Fear of current or ex partner: Not on file    Emotionally abused: Not on file    Physically abused: Not on file    Forced sexual activity: Not on file  Other Topics Concern  . Not on file  Social History Narrative   ** Merged History Encounter **        Past Medical History:  Diagnosis Date  . Anemia   . Anxiety   . Depression   . GERD (gastroesophageal reflux disease)   . Hypertension   . Thyroid disease   . Tremor, unspecified    throat tremor- diagnosed at [place]     Patient Active Problem List   Diagnosis Date Noted  . Perimenopausal vasomotor symptoms 04/04/2015  . Increased BMI 04/04/2015  . Acquired hypothyroidism 08/27/2014  . Absolute anemia 08/27/2014  . Anxiety 08/27/2014  . Arthritis 08/27/2014  . Cataract 08/27/2014  . Hearing loss 08/27/2014  . Clinical depression 08/27/2014  . Essential (primary) hypertension 08/27/2014  . Esophagitis, reflux 08/27/2014  . Diaphragmatic hernia 08/27/2014  . Hypercholesteremia 08/27/2014  . Cannot sleep 08/27/2014  . Adiposity 08/27/2014  . Acquired hypothyroidism 07/27/2014  . Absolute anemia 07/27/2014  . Anxiety 07/27/2014  . Arthritis 07/27/2014  . Cataract 07/27/2014  . Hearing loss 07/27/2014  . Clinical  depression 07/27/2014  . Essential (primary) hypertension 07/27/2014  . Esophagitis, reflux 07/27/2014  . Diaphragmatic hernia 07/27/2014  . Hypercholesteremia 07/27/2014  . Adiposity 07/27/2014    Past Surgical History:  Procedure Laterality Date  . ABDOMINAL HYSTERECTOMY  1983  . ABDOMINAL HYSTERECTOMY    . CATARACT EXTRACTION, BILATERAL  08/2015  . cataract removed    . FOOT SURGERY    . HAND SURGERY    . INNER EAR SURGERY    . LASIK  11/2015  . MIDDLE EAR SURGERY    . THYROIDECTOMY  10/1988  . THYROIDECTOMY      Her family history includes Alcohol abuse in her brother and sister; Arthritis in her son; Brain cancer in her mother; Breast cancer in her maternal  grandmother; COPD in her brother; Cancer in her mother; Diabetes in her brother and sister; Hashimoto's thyroiditis in her son; Hypertension in her father; Hypothyroidism in her son; Lung cancer in her mother; Prostate cancer in her paternal grandfather; Stroke in her father; Thyroid disease in her son.      Current Outpatient Medications:  .  albuterol (PROVENTIL HFA;VENTOLIN HFA) 108 (90 Base) MCG/ACT inhaler, Inhale 2 puffs into the lungs every 6 (six) hours as needed for wheezing or shortness of breath., Disp: 1 Inhaler, Rfl: 2 .  bisoprolol-hydrochlorothiazide (ZIAC) 5-6.25 MG tablet, Take 1 tablet by mouth daily., Disp: 90 tablet, Rfl: 3 .  ferrous sulfate 325 (65 FE) MG tablet, Take by mouth as needed. Reported on 03/06/2015, Disp: , Rfl:  .  Fluticasone-Salmeterol (ADVAIR DISKUS) 250-50 MCG/DOSE AEPB, Inhale 1 puff into the lungs 2 (two) times daily. (Patient not taking: Reported on 07/15/2017), Disp: 1 each, Rfl: 3 .  HYDROcodone-homatropine (HYCODAN) 5-1.5 MG/5ML syrup, Take 5 mLs by mouth every 6 (six) hours as needed for cough. (Patient not taking: Reported on 05/17/2017), Disp: 120 mL, Rfl: 0 .  levothyroxine (SYNTHROID, LEVOTHROID) 125 MCG tablet, Take 1 tablet (125 mcg total) by mouth daily before breakfast., Disp: 90 tablet, Rfl: 3 .  LORazepam (ATIVAN) 0.5 MG tablet, Take 1 tablet (0.5 mg total) by mouth at bedtime., Disp: 90 tablet, Rfl: 1 .  mirtazapine (REMERON) 30 MG tablet, Take 1 tablet (30 mg total) by mouth at bedtime., Disp: 90 tablet, Rfl: 3 .  naproxen (NAPROSYN) 500 MG tablet, Take 1 tablet (500 mg total) by mouth 2 (two) times daily with a meal., Disp: 180 tablet, Rfl: 3 .  omeprazole (PRILOSEC) 40 MG capsule, Take 1 capsule (40 mg total) by mouth daily., Disp: 90 capsule, Rfl: 3 .  sucralfate (CARAFATE) 1 g tablet, Take 1 g by mouth 4 (four) times daily -  with meals and at bedtime., Disp: , Rfl:  .  TURMERIC PO, Take by mouth., Disp: , Rfl:  .  venlafaxine (EFFEXOR) 75 MG  tablet, Take 1 tablet (75 mg total) by mouth 2 (two) times daily., Disp: 180 tablet, Rfl: 3  Patient Care Team: Jerrol Banana., MD as PCP - General (Family Medicine) Vevelyn Royals, MD as Consulting Physician (Ophthalmology) Defrancesco, Alanda Slim, MD as Consulting Physician (Obstetrics and Gynecology)     Objective:   Vitals: There were no vitals taken for this visit.  Physical Exam  Activities of Daily Living In your present state of health, do you have any difficulty performing the following activities: 07/15/2017  Hearing? Y  Comment Has hearing loss due to hx of surgeries on right ear.  Vision? N  Difficulty concentrating or making decisions? N  Walking  or climbing stairs? Y  Comment Due to previous respiratory issues.   Dressing or bathing? N  Doing errands, shopping? N  Preparing Food and eating ? N  Using the Toilet? N  In the past six months, have you accidently leaked urine? N  Do you have problems with loss of bowel control? N  Managing your Medications? N  Managing your Finances? N  Housekeeping or managing your Housekeeping? N  Some recent data might be hidden    Fall Risk Assessment Fall Risk  07/14/2016 03/06/2015  Falls in the past year? No No     Depression Screen PHQ 2/9 Scores 07/15/2017 07/14/2016 07/14/2016 03/06/2015  PHQ - 2 Score 1 0 2 0  PHQ- 9 Score - - 6 -    Pt declined her 6CIT screening at [facility].    Assessment & Plan:    Annual Physical Reviewed patient's Family Medical History Reviewed and updated list of patient's medical providers Assessment of cognitive impairment was done Assessed patient's functional ability Established a written schedule for health screening [place] Completed and Reviewed  Exercise Activities and Dietary recommendations Goals    . DIET - INCREASE WATER INTAKE     Recommend to cut back on caffeinated drinks and increase water intake to 4-6 glasses a day.        Immunization  History  Administered Date(s) Administered  . Influenza, High Dose Seasonal PF 12/14/2016  . Influenza-Unspecified 11/30/2012, 12/17/2014  . Pneumococcal Polysaccharide-23 10/27/2010, 10/27/2010  . Tdap 10/27/2010, 10/27/2010    Health Maintenance  Topic Date Due  . PNA vac Low Risk Adult (2 of 2 - PCV13) 10/27/2011  . MAMMOGRAM  04/16/2017  . INFLUENZA VACCINE  09/30/2017  . TETANUS/TDAP  10/26/2020  . COLONOSCOPY  08/03/2021  . DEXA SCAN  Completed  . Hepatitis C Screening  Completed     Discussed health benefits of physical activity, and encouraged her to engage in regular exercise appropriate for her age and condition.    ------------------------------------------------------------------------------------------------------------

## 2017-08-13 ENCOUNTER — Ambulatory Visit (INDEPENDENT_AMBULATORY_CARE_PROVIDER_SITE_OTHER): Payer: Medicare Other | Admitting: Family Medicine

## 2017-08-13 ENCOUNTER — Encounter: Payer: Self-pay | Admitting: Family Medicine

## 2017-08-13 VITALS — BP 124/76 | HR 72 | Temp 97.7°F | Resp 16 | Ht 63.0 in | Wt 188.0 lb

## 2017-08-13 DIAGNOSIS — E7800 Pure hypercholesterolemia, unspecified: Secondary | ICD-10-CM | POA: Diagnosis not present

## 2017-08-13 DIAGNOSIS — Z23 Encounter for immunization: Secondary | ICD-10-CM | POA: Diagnosis not present

## 2017-08-13 DIAGNOSIS — Z0000 Encounter for general adult medical examination without abnormal findings: Secondary | ICD-10-CM

## 2017-08-13 DIAGNOSIS — F419 Anxiety disorder, unspecified: Secondary | ICD-10-CM

## 2017-08-13 DIAGNOSIS — I10 Essential (primary) hypertension: Secondary | ICD-10-CM

## 2017-08-13 DIAGNOSIS — N951 Menopausal and female climacteric states: Secondary | ICD-10-CM | POA: Diagnosis not present

## 2017-08-13 DIAGNOSIS — D509 Iron deficiency anemia, unspecified: Secondary | ICD-10-CM | POA: Diagnosis not present

## 2017-08-13 DIAGNOSIS — E039 Hypothyroidism, unspecified: Secondary | ICD-10-CM

## 2017-08-13 DIAGNOSIS — Z1231 Encounter for screening mammogram for malignant neoplasm of breast: Secondary | ICD-10-CM | POA: Diagnosis not present

## 2017-08-13 DIAGNOSIS — Z1239 Encounter for other screening for malignant neoplasm of breast: Secondary | ICD-10-CM

## 2017-08-13 MED ORDER — DULOXETINE HCL 60 MG PO CPEP: 60.0000 mg | ORAL_CAPSULE | Freq: Every day | ORAL | 3 refills | Status: DC

## 2017-08-13 MED ORDER — DULOXETINE HCL 30 MG PO CPEP: ORAL_CAPSULE | ORAL | 0 refills | Status: DC

## 2017-08-13 NOTE — Assessment & Plan Note (Signed)
Currently well controlled It is possible that her Effexor is contributing to her diaphoresis and sweating We will stop Effexor and start Cymbalta  quick ramp on Cymbalta to equivalent dose from Effexor Follow-up with PCP as scheduled in 2 to 3 months

## 2017-08-13 NOTE — Progress Notes (Signed)
Patient: Jasmine Rich, Female    DOB: 28-Mar-1945, 72 y.o.   MRN: [id number] Visit Date: 08/13/2017  Today's Provider: Lavon Paganini, MD   I, Martha Clan, CMA, am acting as scribe for Lavon Paganini, MD.  Chief Complaint  Patient presents with  . Annual Exam   Subjective:     Complete Physical Jasmine Rich is a 72 y.o. female. She feels fairly well. She is c/o frequent sweats. She is wondering of this is caused by her venlafaxine vs post-menopausal sx. She is hesitant to use estrogen. She reports exercising daily. Walks her dog and stays busy. She reports she is sleeping well with medication.  Last AWV- 07/15/2017 Last mammogram- UL left- 04/30/2015- BI-RADS 1 Last colonoscopy- 08/04/2011- No polyps. Pt was just diagnosed with Celiac disease, and will have a colonoscopy and EGD on 09/16/2017 to R/O GI bleed. -----------------------------------------------------------   Review of Systems  Constitutional: Positive for diaphoresis and fatigue. Negative for activity change, appetite change, chills, fever and unexpected weight change.  HENT: Negative.   Eyes: Negative.   Respiratory: Negative.   Cardiovascular: Negative.   Gastrointestinal: Negative.   Endocrine: Negative.   Genitourinary: Negative.   Musculoskeletal: Positive for arthralgias. Negative for back pain, gait problem, joint swelling, myalgias, neck pain and neck stiffness.  Skin: Negative.   Allergic/Immunologic: Negative.   Neurological: Negative.   Hematological: Negative.   Psychiatric/Behavioral: Negative.     Social History   Socioeconomic History  . Marital status: Married    Spouse name: Not on file  . Number of children: 2  . Years of education: Not on file  . Highest education level: Some college, no degree  Occupational History  . Not on file  Social Needs  . Financial resource strain: Not hard at all  . Food insecurity:    Worry: Never true    Inability: Never true  .  Transportation needs:    Medical: No    Non-medical: No  Tobacco Use  . Smoking status: Former Smoker    Last attempt to quit: 03/03/1983    Years since quitting: 34.4  . Smokeless tobacco: Never Used  . Tobacco comment: quit smoking in 1985  Substance and Sexual Activity  . Alcohol use: Yes    Alcohol/week: 1.8 oz    Types: 3 Glasses of wine per week  . Drug use: No  . Sexual activity: Not Currently    Birth control/protection: Surgical  Lifestyle  . Physical activity:    Days per week: Not on file    Minutes per session: Not on file  . Stress: To some extent  Relationships  . Social connections:    Talks on phone: Not on file    Gets together: Not on file    Attends religious service: Not on file    Active member of club or organization: Not on file    Attends meetings of clubs or organizations: Not on file    Relationship status: Not on file  . Intimate partner violence:    Fear of current or ex partner: Not on file    Emotionally abused: Not on file    Physically abused: Not on file    Forced sexual activity: Not on file  Other Topics Concern  . Not on file  Social History Narrative   ** Merged History Encounter **        Past Medical History:  Diagnosis Date  . Anemia   . Anxiety   .  Depression   . GERD (gastroesophageal reflux disease)   . Hypertension   . Thyroid disease   . Tremor, unspecified    throat tremor- diagnosed at [facility]     Patient Active Problem List   Diagnosis Date Noted  . Perimenopausal vasomotor symptoms 04/04/2015  . Increased BMI 04/04/2015  . Acquired hypothyroidism 08/27/2014  . Absolute anemia 08/27/2014  . Anxiety 08/27/2014  . Arthritis 08/27/2014  . Cataract 08/27/2014  . Hearing loss 08/27/2014  . Clinical depression 08/27/2014  . Essential (primary) hypertension 08/27/2014  . Esophagitis, reflux 08/27/2014  . Diaphragmatic hernia 08/27/2014  . Hypercholesteremia 08/27/2014  . Cannot sleep 08/27/2014  .  Adiposity 08/27/2014  . Acquired hypothyroidism 07/27/2014  . Absolute anemia 07/27/2014  . Anxiety 07/27/2014  . Arthritis 07/27/2014  . Cataract 07/27/2014  . Hearing loss 07/27/2014  . Clinical depression 07/27/2014  . Essential (primary) hypertension 07/27/2014  . Esophagitis, reflux 07/27/2014  . Diaphragmatic hernia 07/27/2014  . Hypercholesteremia 07/27/2014  . Adiposity 07/27/2014    Past Surgical History:  Procedure Laterality Date  . ABDOMINAL HYSTERECTOMY  1983  . ABDOMINAL HYSTERECTOMY    . CATARACT EXTRACTION, BILATERAL  08/2015  . cataract removed    . FOOT SURGERY    . HAND SURGERY    . INNER EAR SURGERY    . LASIK  11/2015  . MIDDLE EAR SURGERY    . THYROIDECTOMY  10/1988  . THYROIDECTOMY      Her family history includes Alcohol abuse in her brother and sister; Arthritis in her son; Brain cancer in her mother; Breast cancer in her maternal grandmother; COPD in her brother; Cancer in her mother; Diabetes in her brother and sister; Hashimoto's thyroiditis in her son; Hypertension in her father; Hypothyroidism in her son; Lung cancer in her mother; Prostate cancer in her paternal grandfather; Stroke in her father; Thyroid disease in her son.      Current Outpatient Medications:  .  albuterol (PROVENTIL HFA;VENTOLIN HFA) 108 (90 Base) MCG/ACT inhaler, Inhale 2 puffs into the lungs every 6 (six) hours as needed for wheezing or shortness of breath., Disp: 1 Inhaler, Rfl: 2 .  bisoprolol-hydrochlorothiazide (ZIAC) 5-6.25 MG tablet, Take 1 tablet by mouth daily., Disp: 90 tablet, Rfl: 3 .  ferrous sulfate 325 (65 FE) MG tablet, Take by mouth as needed. Reported on 03/06/2015, Disp: , Rfl:  .  levothyroxine (SYNTHROID, LEVOTHROID) 125 MCG tablet, Take 1 tablet (125 mcg total) by mouth daily before breakfast., Disp: 90 tablet, Rfl: 3 .  LORazepam (ATIVAN) 0.5 MG tablet, Take 1 tablet (0.5 mg total) by mouth at bedtime., Disp: 90 tablet, Rfl: 1 .  mirtazapine (REMERON) 30 MG  tablet, Take 1 tablet (30 mg total) by mouth at bedtime., Disp: 90 tablet, Rfl: 3 .  omeprazole (PRILOSEC) 40 MG capsule, Take 1 capsule (40 mg total) by mouth daily., Disp: 90 capsule, Rfl: 3 .  TURMERIC PO, Take by mouth., Disp: , Rfl:  .  venlafaxine (EFFEXOR) 75 MG tablet, Take 1 tablet (75 mg total) by mouth 2 (two) times daily., Disp: 180 tablet, Rfl: 3  Patient Care Team: Jerrol Banana., MD as PCP - General (Family Medicine) Vevelyn Royals, MD as Consulting Physician (Ophthalmology) Defrancesco, Alanda Slim, MD as Consulting Physician (Obstetrics and Gynecology)     Objective:   Vitals: BP 124/76 (BP Location: Left Arm, Patient Position: Sitting, Cuff Size: Large)   Pulse 72   Temp 97.7 F (36.5 C) (Oral)   Resp 16  Ht 5\' 3"  (1.6 m)   Wt 188 lb (85.3 kg)   SpO2 96%   BMI 33.30 kg/m   Physical Exam  Constitutional: She is oriented to person, place, and time. She appears well-developed and well-nourished. No distress.  HENT:  Head: Normocephalic and atraumatic.  Right Ear: External ear normal.  Left Ear: External ear normal.  Nose: Nose normal.  Mouth/Throat: Oropharynx is clear and moist.  Eyes: Pupils are equal, round, and reactive to light. Conjunctivae and EOM are normal. No scleral icterus.  Neck: Neck supple. No thyromegaly present.  Cardiovascular: Normal rate, regular rhythm, normal heart sounds and intact distal pulses.  No murmur heard. Pulmonary/Chest: Effort normal and breath sounds normal. No respiratory distress. She has no wheezes. She has no rales.  Abdominal: Soft. Bowel sounds are normal. She exhibits no distension. There is no tenderness. There is no rebound and no guarding.  Genitourinary:  Genitourinary Comments: Breasts: breasts appear normal, no suspicious masses, no skin or nipple changes or axillary nodes.   Musculoskeletal: She exhibits no edema or deformity.  Lymphadenopathy:    She has no cervical adenopathy.  Neurological: She is  alert and oriented to person, place, and time.  Skin: Skin is warm and dry. Capillary refill takes less than 2 seconds. No rash noted.  Psychiatric: She has a normal mood and affect. Her behavior is normal.  Vitals reviewed.   Activities of Daily Living In your present state of health, do you have any difficulty performing the following activities: 07/15/2017  Hearing? Y  Comment Has hearing loss due to hx of surgeries on right ear.  Vision? N  Difficulty concentrating or making decisions? N  Walking or climbing stairs? Y  Comment Due to previous respiratory issues.   Dressing or bathing? N  Doing errands, shopping? N  Preparing Food and eating ? N  Using the Toilet? N  In the past six months, have you accidently leaked urine? N  Do you have problems with loss of bowel control? N  Managing your Medications? N  Managing your Finances? N  Housekeeping or managing your Housekeeping? N  Some recent data might be hidden    Fall Risk Assessment Fall Risk  08/13/2017 07/14/2016 03/06/2015  Falls in the past year? No No No     Depression Screen PHQ 2/9 Scores 07/15/2017 07/14/2016 07/14/2016 03/06/2015  PHQ - 2 Score 1 0 2 0  PHQ- 9 Score - - 6 -    Pt declines 6CIT screening at her AWV.   Assessment & Plan:    Annual Physical Reviewed patient's Family Medical History Reviewed and updated list of patient's medical providers Assessment of cognitive impairment was done Assessed patient's functional ability Established a written schedule for health screening [place] Completed and Reviewed  Exercise Activities and Dietary recommendations Goals    . DIET - INCREASE WATER INTAKE     Recommend to cut back on caffeinated drinks and increase water intake to 4-6 glasses a day.        Immunization History  Administered Date(s) Administered  . Influenza, High Dose Seasonal PF 12/14/2016  . Influenza-Unspecified 11/30/2012, 12/17/2014  . Pneumococcal  Polysaccharide-23 10/27/2010, 10/27/2010  . Tdap 10/27/2010, 10/27/2010    Health Maintenance  Topic Date Due  . PNA vac Low Risk Adult (2 of 2 - PCV13) 10/27/2011  . MAMMOGRAM  04/16/2017  . INFLUENZA VACCINE  09/30/2017  . TETANUS/TDAP  10/26/2020  . COLONOSCOPY  08/03/2021  . DEXA SCAN  Completed  . Hepatitis C Screening  Completed     Discussed health benefits of physical activity, and encouraged her to engage in regular exercise appropriate for her age and condition.    ------------------------------------------------------------------------------------------------------------  Problem List Items Addressed This Visit      Cardiovascular and Mediastinum   Essential (primary) hypertension   Relevant Orders   Comprehensive metabolic panel     Endocrine   Acquired hypothyroidism    TSH was low and Synthroid dose was adjusted  continue current Synthroid dose, but possible titration pending lab results      Relevant Orders   TSH     Other   Anxiety    Currently well controlled It is possible that her Effexor is contributing to her diaphoresis and sweating We will stop Effexor and start Cymbalta  quick ramp on Cymbalta to equivalent dose from Effexor Follow-up with PCP as scheduled in 2 to 3 months      Relevant Medications   DULoxetine (CYMBALTA) 60 MG capsule   Hypercholesteremia   Relevant Orders   Lipid panel   Absolute anemia   Relevant Orders   CBC   Vasomotor symptoms due to menopause    Symptoms may be related to being postmenopausal, or may be related to her Effexor  we will switch Effexor to Cymbalta It is possible that if this is postmenopausal vasomotor symptoms, Cymbalta may help for this reason as well       Other Visit Diagnoses    Encounter for annual physical exam    -  Primary   Screening for breast cancer       Relevant Orders   MS DIGITAL SCREENING TOMO BILATERAL   Need for pneumococcal vaccination       Relevant Orders    Pneumococcal conjugate vaccine 13-valent IM (Completed)       Return in about 3 months (around 11/13/2017) for as scheduled with PCP.   The entirety of the information documented in the History of Present Illness, Review of Systems and Physical Exam were personally obtained by me. Portions of this information were initially documented by Raquel Sarna Ratchford, CMA and reviewed by me for thoroughness and accuracy.    Virginia Crews, MD, MPH [facility] 08/13/2017 3:14 PM

## 2017-08-13 NOTE — Patient Instructions (Addendum)
Stop Effexor and start Cymbalta 26m daily for 1 week.  Then increase to 666mdaily.  Call to schedule mammogram   Come back for blood work one morning next week when fasting.  OK to have water   Preventive Care 65 Years and Older, Female Preventive care refers to lifestyle choices and visits with your health care provider that can promote health and wellness. What does preventive care include?  A yearly physical exam. This is also called an annual well check.  Dental exams once or twice a year.  Routine eye exams. Ask your health care provider how often you should have your eyes checked.  Personal lifestyle choices, including: ? Daily care of your teeth and gums. ? Regular physical activity. ? Eating a healthy diet. ? Avoiding tobacco and drug use. ? Limiting alcohol use. ? Practicing safe sex. ? Taking low-dose aspirin every day. ? Taking vitamin and mineral supplements as recommended by your health care provider. What happens during an annual well check? The services and screenings done by your health care provider during your annual well check will depend on your age, overall health, lifestyle risk factors, and family history of disease. Counseling Your health care provider may ask you questions about your:  Alcohol use.  Tobacco use.  Drug use.  Emotional well-being.  Home and relationship well-being.  Sexual activity.  Eating habits.  History of falls.  Memory and ability to understand (cognition).  Work and work enStatistician Reproductive health.  Screening You may have the following tests or measurements:  Height, weight, and BMI.  Blood pressure.  Lipid and cholesterol levels. These may be checked every 5 years, or more frequently if you are over 5062ears old.  Skin check.  Lung cancer screening. You may have this screening every year starting at age 773f you have a 30-pack-year history of smoking and currently smoke or have quit within the  past 15 years.  Fecal occult blood test (FOBT) of the stool. You may have this test every year starting at age 72 Flexible sigmoidoscopy or colonoscopy. You may have a sigmoidoscopy every 5 years or a colonoscopy every 10 years starting at age 72 Hepatitis C blood test.  Hepatitis B blood test.  Sexually transmitted disease (STD) testing.  Diabetes screening. This is done by checking your blood sugar (glucose) after you have not eaten for a while (fasting). You may have this done every 1-3 years.  Bone density scan. This is done to screen for osteoporosis. You may have this done starting at age 72 Mammogram. This may be done every 1-2 years. Talk to your health care provider about how often you should have regular mammograms.  Talk with your health care provider about your test results, treatment options, and if necessary, the need for more tests. Vaccines Your health care provider may recommend certain vaccines, such as:  Influenza vaccine. This is recommended every year.  Tetanus, diphtheria, and acellular pertussis (Tdap, Td) vaccine. You may need a Td booster every 10 years.  Varicella vaccine. You may need this if you have not been vaccinated.  Zoster vaccine. You may need this after age 72 Measles, mumps, and rubella (MMR) vaccine. You may need at least one dose of MMR if you were born in 1957 or later. You may also need a second dose.  Pneumococcal 13-valent conjugate (PCV13) vaccine. One dose is recommended after age 72 Pneumococcal polysaccharide (PPSV23) vaccine. One dose is recommended after age  65.  Meningococcal vaccine. You may need this if you have certain conditions.  Hepatitis A vaccine. You may need this if you have certain conditions or if you travel or work in places where you may be exposed to hepatitis A.  Hepatitis B vaccine. You may need this if you have certain conditions or if you travel or work in places where you may be exposed to  hepatitis B.  Haemophilus influenzae type b (Hib) vaccine. You may need this if you have certain conditions.  Talk to your health care provider about which screenings and vaccines you need and how often you need them. This information is not intended to replace advice given to you by your health care provider. Make sure you discuss any questions you have with your health care provider. Document Released: 03/15/2015 Document Revised: 11/06/2015 Document Reviewed: 12/18/2014 Elsevier Interactive Patient Education  [employer].

## 2017-08-13 NOTE — Assessment & Plan Note (Signed)
Symptoms may be related to being postmenopausal, or may be related to her Effexor  we will switch Effexor to Cymbalta It is possible that if this is postmenopausal vasomotor symptoms, Cymbalta may help for this reason as well

## 2017-08-13 NOTE — Assessment & Plan Note (Signed)
TSH was low and Synthroid dose was adjusted  continue current Synthroid dose, but possible titration pending lab results

## 2017-08-16 ENCOUNTER — Telehealth: Payer: Self-pay

## 2017-08-16 NOTE — Telephone Encounter (Signed)
Pt called stating [facility] is needing a PA for Cymbalta.    The number is [phone] and the reference number is: [id number]  Thanks,   -Mickel Baas

## 2017-08-17 ENCOUNTER — Other Ambulatory Visit: Payer: Self-pay | Admitting: Family Medicine

## 2017-08-17 DIAGNOSIS — I10 Essential (primary) hypertension: Secondary | ICD-10-CM | POA: Diagnosis not present

## 2017-08-17 DIAGNOSIS — Z1231 Encounter for screening mammogram for malignant neoplasm of breast: Secondary | ICD-10-CM

## 2017-08-17 DIAGNOSIS — E7800 Pure hypercholesterolemia, unspecified: Secondary | ICD-10-CM | POA: Diagnosis not present

## 2017-08-17 DIAGNOSIS — D509 Iron deficiency anemia, unspecified: Secondary | ICD-10-CM | POA: Diagnosis not present

## 2017-08-17 DIAGNOSIS — E039 Hypothyroidism, unspecified: Secondary | ICD-10-CM | POA: Diagnosis not present

## 2017-08-18 LAB — CBC
Hematocrit: 40.4 % (ref 34.0–46.6)
Hemoglobin: 13.1 g/dL (ref 11.1–15.9)
MCH: 28.3 pg (ref 26.6–33.0)
MCHC: 32.4 g/dL (ref 31.5–35.7)
MCV: 87 fL (ref 79–97)
Platelets: 390 10*3/uL (ref 150–450)
RBC: 4.63 x10E6/uL (ref 3.77–5.28)
RDW: 18.6 % — AB (ref 12.3–15.4)
WBC: 6.9 10*3/uL (ref 3.4–10.8)

## 2017-08-18 LAB — COMPREHENSIVE METABOLIC PANEL
A/G RATIO: 1.9 (ref 1.2–2.2)
ALBUMIN: 4.6 g/dL (ref 3.5–4.8)
ALT: 13 IU/L (ref 0–32)
AST: 25 IU/L (ref 0–40)
Alkaline Phosphatase: 103 IU/L (ref 39–117)
BUN / CREAT RATIO: 14 (ref 12–28)
BUN: 14 mg/dL (ref 8–27)
Bilirubin Total: 0.3 mg/dL (ref 0.0–1.2)
CALCIUM: 9.9 mg/dL (ref 8.7–10.3)
CO2: 23 mmol/L (ref 20–29)
Chloride: 99 mmol/L (ref 96–106)
Creatinine, Ser: 1.03 mg/dL — ABNORMAL HIGH (ref 0.57–1.00)
GFR calc Af Amer: 63 mL/min/{1.73_m2} (ref >59)
GFR, EST NON AFRICAN AMERICAN: 54 mL/min/{1.73_m2} — AB (ref >59)
Globulin, Total: 2.4 g/dL (ref 1.5–4.5)
Glucose: 117 mg/dL — ABNORMAL HIGH (ref 65–99)
POTASSIUM: 4.5 mmol/L (ref 3.5–5.2)
SODIUM: 140 mmol/L (ref 134–144)
Total Protein: 7 g/dL (ref 6.0–8.5)

## 2017-08-18 LAB — LIPID PANEL
Chol/HDL Ratio: 6.6 ratio — ABNORMAL HIGH (ref 0.0–4.4)
Cholesterol, Total: 205 mg/dL — ABNORMAL HIGH (ref 100–199)
HDL: 31 mg/dL — AB (ref >39)
LDL CALC: 114 mg/dL — AB (ref 0–99)
TRIGLYCERIDES: 302 mg/dL — AB (ref 0–149)
VLDL CHOLESTEROL CAL: 60 mg/dL — AB (ref 5–40)

## 2017-08-18 LAB — TSH: TSH: 5.98 u[IU]/mL — ABNORMAL HIGH (ref 0.450–4.500)

## 2017-08-19 ENCOUNTER — Other Ambulatory Visit: Payer: Self-pay | Admitting: Family Medicine

## 2017-08-19 DIAGNOSIS — E039 Hypothyroidism, unspecified: Secondary | ICD-10-CM

## 2017-08-19 MED ORDER — LEVOTHYROXINE SODIUM 137 MCG PO TABS: 137.0000 ug | ORAL_TABLET | Freq: Every day | ORAL | 2 refills | Status: DC

## 2017-08-20 ENCOUNTER — Telehealth: Payer: Self-pay

## 2017-08-20 DIAGNOSIS — E039 Hypothyroidism, unspecified: Secondary | ICD-10-CM

## 2017-08-20 MED ORDER — LEVOTHYROXINE SODIUM 137 MCG PO TABS
137.0000 ug | ORAL_TABLET | Freq: Every day | ORAL | 1 refills | Status: DC
Start: 2017-08-20 — End: 2018-03-01

## 2017-08-20 NOTE — Telephone Encounter (Signed)
-----   Message from Virginia Crews, MD sent at 08/19/2017  4:26 PM EDT ----- Normal electrolytes, liver function, blood counts.  Kidney function is stable.  Blood sugar is elevated if the patient was fasting.  This could represent prediabetes.  Recommend low-carb diet.  May consider checking A1c, which is a 66-month average of blood sugars, at patient's next visit.    Thyroid hormone is elevated, meaning that Synthroid dose is slightly too low.  New prescription for Synthroid 137 mcg daily was sent to the patient's pharmacy.  She should start taking this instead of the 125 mcg tablet.  Her thyroid function should be rechecked at her next follow-up visit.  Cholesterol is stable, but elevated.  10 year risk of heart disease/stroke is high at 15.6 %.  This suggests that patient would benefit from statin therapy to lower her cholesterol.  If she is agreeable to this, I am happy with starting atorvastatin 40 mg daily.  She could also instead discuss with her PCP at next follow-up visit if she prefers.  Virginia Crews, MD, MPH [facility] 08/19/2017 4:26 PM

## 2017-08-20 NOTE — Telephone Encounter (Signed)
Pt advised. Resent levothyroxine to [facility]. Would like to discuss cholesterol with PCP at [facility].

## 2017-08-20 NOTE — Telephone Encounter (Signed)
Called number listed below, and I was directed to answer a question about the Cymbalta. Was informed that I do not need to do a PA. Called [phone], and had to leave a message. Gave them reference and asked them to call back or fax this over to [place].

## 2017-08-23 DIAGNOSIS — M62830 Muscle spasm of back: Secondary | ICD-10-CM | POA: Diagnosis not present

## 2017-08-23 DIAGNOSIS — M9902 Segmental and somatic dysfunction of thoracic region: Secondary | ICD-10-CM | POA: Diagnosis not present

## 2017-08-23 DIAGNOSIS — M5136 Other intervertebral disc degeneration, lumbar region: Secondary | ICD-10-CM | POA: Diagnosis not present

## 2017-08-23 DIAGNOSIS — M9903 Segmental and somatic dysfunction of lumbar region: Secondary | ICD-10-CM | POA: Diagnosis not present

## 2017-08-31 ENCOUNTER — Ambulatory Visit
Admission: RE | Admit: 2017-08-31 | Discharge: 2017-08-31 | Disposition: A | Discharge status: Home / Self Care | Payer: Medicare Other | Source: Ambulatory Visit | Attending: Family Medicine | Admitting: Family Medicine

## 2017-08-31 DIAGNOSIS — Z1231 Encounter for screening mammogram for malignant neoplasm of breast: Secondary | ICD-10-CM | POA: Diagnosis not present

## 2017-09-07 ENCOUNTER — Telehealth: Payer: Self-pay

## 2017-09-07 NOTE — Telephone Encounter (Signed)
Pt advised.

## 2017-09-07 NOTE — Telephone Encounter (Signed)
-----   Message from Virginia Crews, MD sent at 09/06/2017  8:37 AM EDT ----- Normal mammogram.  Repeat in 1 year.  Virginia Crews, MD, MPH [facility] 09/06/2017 8:37 AM

## 2017-09-16 DIAGNOSIS — D126 Benign neoplasm of colon, unspecified: Secondary | ICD-10-CM | POA: Diagnosis not present

## 2017-09-16 DIAGNOSIS — D125 Benign neoplasm of sigmoid colon: Secondary | ICD-10-CM | POA: Diagnosis not present

## 2017-09-16 DIAGNOSIS — K640 First degree hemorrhoids: Secondary | ICD-10-CM | POA: Diagnosis not present

## 2017-09-16 DIAGNOSIS — K449 Diaphragmatic hernia without obstruction or gangrene: Secondary | ICD-10-CM | POA: Diagnosis not present

## 2017-09-16 DIAGNOSIS — K621 Rectal polyp: Secondary | ICD-10-CM | POA: Diagnosis not present

## 2017-09-16 DIAGNOSIS — K3189 Other diseases of stomach and duodenum: Secondary | ICD-10-CM | POA: Diagnosis not present

## 2017-09-16 DIAGNOSIS — K648 Other hemorrhoids: Secondary | ICD-10-CM | POA: Diagnosis not present

## 2017-09-16 DIAGNOSIS — D509 Iron deficiency anemia, unspecified: Secondary | ICD-10-CM | POA: Diagnosis not present

## 2017-09-16 DIAGNOSIS — D129 Benign neoplasm of anus and anal canal: Secondary | ICD-10-CM | POA: Diagnosis not present

## 2017-09-16 DIAGNOSIS — K2990 Gastroduodenitis, unspecified, without bleeding: Secondary | ICD-10-CM | POA: Diagnosis not present

## 2017-09-16 DIAGNOSIS — K5730 Diverticulosis of large intestine without perforation or abscess without bleeding: Secondary | ICD-10-CM | POA: Diagnosis not present

## 2017-09-16 DIAGNOSIS — K2950 Unspecified chronic gastritis without bleeding: Secondary | ICD-10-CM | POA: Diagnosis not present

## 2017-09-16 LAB — HM COLONOSCOPY

## 2017-10-01 ENCOUNTER — Other Ambulatory Visit: Payer: Self-pay

## 2017-11-02 DIAGNOSIS — M5136 Other intervertebral disc degeneration, lumbar region: Secondary | ICD-10-CM | POA: Diagnosis not present

## 2017-11-02 DIAGNOSIS — M9902 Segmental and somatic dysfunction of thoracic region: Secondary | ICD-10-CM | POA: Diagnosis not present

## 2017-11-02 DIAGNOSIS — M9903 Segmental and somatic dysfunction of lumbar region: Secondary | ICD-10-CM | POA: Diagnosis not present

## 2017-11-02 DIAGNOSIS — M62830 Muscle spasm of back: Secondary | ICD-10-CM | POA: Diagnosis not present

## 2017-11-17 ENCOUNTER — Ambulatory Visit (INDEPENDENT_AMBULATORY_CARE_PROVIDER_SITE_OTHER): Payer: Medicare Other | Admitting: Family Medicine

## 2017-11-17 ENCOUNTER — Encounter: Payer: Self-pay | Admitting: Family Medicine

## 2017-11-17 VITALS — BP 126/70 | HR 76 | Temp 98.0°F | Resp 16 | Wt 192.0 lb

## 2017-11-17 DIAGNOSIS — Z6834 Body mass index (BMI) 34.0-34.9, adult: Secondary | ICD-10-CM | POA: Diagnosis not present

## 2017-11-17 DIAGNOSIS — K2100 Gastro-esophageal reflux disease with esophagitis, without bleeding: Secondary | ICD-10-CM

## 2017-11-17 DIAGNOSIS — I10 Essential (primary) hypertension: Secondary | ICD-10-CM | POA: Diagnosis not present

## 2017-11-17 DIAGNOSIS — E6609 Other obesity due to excess calories: Secondary | ICD-10-CM | POA: Diagnosis not present

## 2017-11-17 DIAGNOSIS — E039 Hypothyroidism, unspecified: Secondary | ICD-10-CM | POA: Diagnosis not present

## 2017-11-17 DIAGNOSIS — Z23 Encounter for immunization: Secondary | ICD-10-CM | POA: Diagnosis not present

## 2017-11-17 NOTE — Progress Notes (Signed)
Patient: Jasmine Rich Female    DOB: 1945/04/12   72 y.o.   MRN: [id number] Visit Date: 11/17/2017  Today's Provider: [name], MD   Chief Complaint  Patient presents with  . Hypothyroidism  . Hyperglycemia  . Hyperlipidemia   Subjective:    HPI Patient comes in today for a follow up. She was last seen in the office 3 months ago.   Since last visit, patient was advised to increase Synthroid from 114mcg to 173mcg. She reports that she is tolerating med changes well.  Lab Results  Component Value Date   TSH 5.980 (H) 08/17/2017   Patient also mentions that her blood sugar was elevated on last blood draw. She reports that she was not fasting at the time her labs was collected.  She remains concerned about her husbands PTSD.  Allergies  Allergen Reactions  . Codeine Other (See Comments)    Abdominal pain and nausea     Current Outpatient Medications:  .  albuterol (PROVENTIL HFA;VENTOLIN HFA) 108 (90 Base) MCG/ACT inhaler, Inhale 2 puffs into the lungs every 6 (six) hours as needed for wheezing or shortness of breath., Disp: 1 Inhaler, Rfl: 2 .  bisoprolol-hydrochlorothiazide (ZIAC) 5-6.25 MG tablet, Take 1 tablet by mouth daily., Disp: 90 tablet, Rfl: 3 .  DULoxetine (CYMBALTA) 60 MG capsule, Take 1 capsule (60 mg total) by mouth daily., Disp: 90 capsule, Rfl: 3 .  ferrous sulfate 325 (65 FE) MG tablet, Take by mouth as needed. Reported on 03/06/2015, Disp: , Rfl:  .  levothyroxine (SYNTHROID, LEVOTHROID) 137 MCG tablet, Take 1 tablet (137 mcg total) by mouth daily before breakfast., Disp: 90 tablet, Rfl: 1 .  LORazepam (ATIVAN) 0.5 MG tablet, Take 1 tablet (0.5 mg total) by mouth at bedtime., Disp: 90 tablet, Rfl: 1 .  mirtazapine (REMERON) 30 MG tablet, Take 1 tablet (30 mg total) by mouth at bedtime., Disp: 90 tablet, Rfl: 3 .  omeprazole (PRILOSEC) 40 MG capsule, Take 1 capsule (40 mg total) by mouth daily., Disp: 90 capsule, Rfl: 3 .  TURMERIC PO, Take by  mouth., Disp: , Rfl:   Review of Systems  Constitutional: Negative for activity change, appetite change, diaphoresis, fatigue, fever and unexpected weight change.  HENT: Negative.   Respiratory: Negative for cough and shortness of breath.   Cardiovascular: Negative for chest pain, palpitations and leg swelling.  Gastrointestinal: Negative.   Endocrine: Negative.  Negative for cold intolerance, heat intolerance, polydipsia, polyphagia and polyuria.  Musculoskeletal: Negative for arthralgias and gait problem.  Allergic/Immunologic: Negative.   Neurological: Negative for dizziness, light-headedness and headaches.  Hematological: Negative.     Social History   Tobacco Use  . Smoking status: Former Smoker    Last attempt to quit: 03/03/1983    Years since quitting: 34.7  . Smokeless tobacco: Never Used  . Tobacco comment: quit smoking in 1985  Substance Use Topics  . Alcohol use: Yes    Alcohol/week: 3.0 standard drinks    Types: 3 Glasses of wine per week   Objective:   BP 126/70 (BP Location: Right Arm, Patient Position: Sitting, Cuff Size: Normal)   Pulse 76   Temp 98 F (36.7 C)   Resp 16   Wt 192 lb (87.1 kg)   SpO2 96%   BMI 34.01 kg/m  Vitals:   11/17/17 1330  BP: 126/70  Pulse: 76  Resp: 16  Temp: 98 F (36.7 C)  SpO2: 96%  Weight: 192  lb (87.1 kg)     Physical Exam  Constitutional: She is oriented to person, place, and time. She appears well-developed and well-nourished.  HENT:  Head: Normocephalic and atraumatic.  Right Ear: External ear normal.  Left Ear: External ear normal.  Nose: Nose normal.  Eyes: Conjunctivae are normal. No scleral icterus.  Neck: No thyromegaly present.  Cardiovascular: Normal rate, regular rhythm and normal heart sounds.  Pulmonary/Chest: Effort normal and breath sounds normal.  Abdominal: Soft.  Musculoskeletal: She exhibits no edema.  Lymphadenopathy:    She has no cervical adenopathy.  Neurological: She is alert and  oriented to person, place, and time.  Skin: Skin is warm and dry.  Psychiatric: She has a normal mood and affect. Her behavior is normal. Judgment and thought content normal.        Assessment & Plan:      1. Need for influenza vaccination  - Flu vaccine HIGH DOSE PF (Fluzone High dose)  2. Essential (primary) hypertension   3. Esophagitis, reflux   4. Acquired hypothyroidism   5. Class 1 obesity due to excess calories with serious comorbidity and body mass index (BMI) of 34.0 to 34.9 in adult With HTN/GERD.     I have done the exam and reviewed the chart and it is accurate to the best of my knowledge. Development worker, community has been used and  any errors in dictation or transcription are unintentional. Miguel Aschoff M.D. [place], MD  [facility]

## 2018-01-20 DIAGNOSIS — M9902 Segmental and somatic dysfunction of thoracic region: Secondary | ICD-10-CM | POA: Diagnosis not present

## 2018-01-20 DIAGNOSIS — M5136 Other intervertebral disc degeneration, lumbar region: Secondary | ICD-10-CM | POA: Diagnosis not present

## 2018-01-20 DIAGNOSIS — M9903 Segmental and somatic dysfunction of lumbar region: Secondary | ICD-10-CM | POA: Diagnosis not present

## 2018-01-20 DIAGNOSIS — M62830 Muscle spasm of back: Secondary | ICD-10-CM | POA: Diagnosis not present

## 2018-02-03 DIAGNOSIS — L821 Other seborrheic keratosis: Secondary | ICD-10-CM | POA: Diagnosis not present

## 2018-02-03 DIAGNOSIS — L538 Other specified erythematous conditions: Secondary | ICD-10-CM | POA: Diagnosis not present

## 2018-02-03 DIAGNOSIS — L820 Inflamed seborrheic keratosis: Secondary | ICD-10-CM | POA: Diagnosis not present

## 2018-02-25 ENCOUNTER — Other Ambulatory Visit: Payer: Self-pay | Admitting: Family Medicine

## 2018-02-25 DIAGNOSIS — I10 Essential (primary) hypertension: Secondary | ICD-10-CM

## 2018-02-25 DIAGNOSIS — E039 Hypothyroidism, unspecified: Secondary | ICD-10-CM

## 2018-02-25 NOTE — Telephone Encounter (Signed)
Pt needing 90 day supply on:        bisoprolol-hydrochlorothiazide (ZIAC) 5-6.25 MG tablet mirtazapine (REMERON) 30 MG tablet levothyroxine (SYNTHROID, LEVOTHROID) 137 MCG tablet omeprazole (PRILOSEC) 40 MG capsule    TURMERIC PO   Pt is almost out.      Please call into:  [facility] - [place], [place] - [address] [phone] (Phone) [phone] (Fax)   Thanks, TGh

## 2018-03-01 NOTE — Telephone Encounter (Signed)
Please review. Thanks!  

## 2018-03-07 DIAGNOSIS — M5033 Other cervical disc degeneration, cervicothoracic region: Secondary | ICD-10-CM | POA: Diagnosis not present

## 2018-03-07 DIAGNOSIS — M9901 Segmental and somatic dysfunction of cervical region: Secondary | ICD-10-CM | POA: Diagnosis not present

## 2018-03-07 DIAGNOSIS — M9903 Segmental and somatic dysfunction of lumbar region: Secondary | ICD-10-CM | POA: Diagnosis not present

## 2018-03-07 DIAGNOSIS — R51 Headache: Secondary | ICD-10-CM | POA: Diagnosis not present

## 2018-03-07 MED ORDER — BISOPROLOL-HYDROCHLOROTHIAZIDE 5-6.25 MG PO TABS: 1.0000 | ORAL_TABLET | Freq: Every day | ORAL | 3 refills | Status: DC

## 2018-03-07 MED ORDER — MIRTAZAPINE 30 MG PO TABS: 30.0000 mg | ORAL_TABLET | Freq: Every day | ORAL | 3 refills | Status: DC

## 2018-03-07 MED ORDER — OMEPRAZOLE 40 MG PO CPDR: 40.0000 mg | DELAYED_RELEASE_CAPSULE | Freq: Every day | ORAL | 3 refills | Status: DC

## 2018-03-07 MED ORDER — LEVOTHYROXINE SODIUM 137 MCG PO TABS: 137.0000 ug | ORAL_TABLET | Freq: Every day | ORAL | 1 refills | Status: DC

## 2018-03-22 ENCOUNTER — Other Ambulatory Visit: Payer: Self-pay | Admitting: Family Medicine

## 2018-03-22 DIAGNOSIS — I10 Essential (primary) hypertension: Secondary | ICD-10-CM

## 2018-03-22 MED ORDER — BISOPROLOL-HYDROCHLOROTHIAZIDE 5-6.25 MG PO TABS: 1.0000 | ORAL_TABLET | Freq: Every day | ORAL | 3 refills | Status: DC

## 2018-03-22 NOTE — Telephone Encounter (Signed)
Patient needs refills on Bisoprolol Fumarate 5/6.25 mg.   Patient usually gets these through the [place] but they cannot get this any longer.   Please call this into [facility].

## 2018-04-05 DIAGNOSIS — M5033 Other cervical disc degeneration, cervicothoracic region: Secondary | ICD-10-CM | POA: Diagnosis not present

## 2018-04-05 DIAGNOSIS — R51 Headache: Secondary | ICD-10-CM | POA: Diagnosis not present

## 2018-04-05 DIAGNOSIS — M9903 Segmental and somatic dysfunction of lumbar region: Secondary | ICD-10-CM | POA: Diagnosis not present

## 2018-04-05 DIAGNOSIS — M9901 Segmental and somatic dysfunction of cervical region: Secondary | ICD-10-CM | POA: Diagnosis not present

## 2018-04-19 DIAGNOSIS — M5033 Other cervical disc degeneration, cervicothoracic region: Secondary | ICD-10-CM | POA: Diagnosis not present

## 2018-04-19 DIAGNOSIS — R51 Headache: Secondary | ICD-10-CM | POA: Diagnosis not present

## 2018-04-19 DIAGNOSIS — M9903 Segmental and somatic dysfunction of lumbar region: Secondary | ICD-10-CM | POA: Diagnosis not present

## 2018-04-19 DIAGNOSIS — M9901 Segmental and somatic dysfunction of cervical region: Secondary | ICD-10-CM | POA: Diagnosis not present

## 2018-05-18 ENCOUNTER — Ambulatory Visit: Payer: Self-pay | Admitting: Family Medicine

## 2018-07-04 DIAGNOSIS — M9901 Segmental and somatic dysfunction of cervical region: Secondary | ICD-10-CM | POA: Diagnosis not present

## 2018-07-04 DIAGNOSIS — M5033 Other cervical disc degeneration, cervicothoracic region: Secondary | ICD-10-CM | POA: Diagnosis not present

## 2018-07-04 DIAGNOSIS — R51 Headache: Secondary | ICD-10-CM | POA: Diagnosis not present

## 2018-07-04 DIAGNOSIS — M9903 Segmental and somatic dysfunction of lumbar region: Secondary | ICD-10-CM | POA: Diagnosis not present

## 2018-07-19 ENCOUNTER — Ambulatory Visit (INDEPENDENT_AMBULATORY_CARE_PROVIDER_SITE_OTHER): Payer: Medicare Other

## 2018-07-19 ENCOUNTER — Other Ambulatory Visit: Payer: Self-pay

## 2018-07-19 DIAGNOSIS — Z0000 Encounter for general adult medical examination without abnormal findings: Secondary | ICD-10-CM | POA: Diagnosis not present

## 2018-07-19 DIAGNOSIS — E2839 Other primary ovarian failure: Secondary | ICD-10-CM

## 2018-07-19 NOTE — Progress Notes (Signed)
Subjective:   Jasmine Rich is a 73 y.o. female who presents for Medicare Annual (Subsequent) preventive examination.    This visit is being conducted through telemedicine due to the COVID-19 pandemic. This patient has given me verbal consent via doximity to conduct this visit, patient states they are participating from their home address. Some vital signs may be absent or patient reported.    Patient identification: identified by name, DOB, and current address  Review of Systems:  N/A  Cardiac Risk Factors include: advanced age (>10men, >1 women);dyslipidemia;hypertension;obesity (BMI >30kg/m2)     Objective:     Vitals: There were no vitals taken for this visit.  There is no height or weight on file to calculate BMI. Unable to obtain vitals due to visit being conducted via telephonically.   Advanced Directives 07/19/2018 07/15/2017 07/14/2016 07/15/2015  Does Patient Have a Medical Advance Directive? No No No No  Would patient like information on creating a medical advance directive? Yes (MAU/Ambulatory/Procedural Areas - Information given) Yes (MAU/Ambulatory/Procedural Areas - Information given) Yes (MAU/Ambulatory/Procedural Areas - Information given) -    Tobacco Social History   Tobacco Use  Smoking Status Former Smoker  . Last attempt to quit: 03/03/1983  . Years since quitting: 35.4  Smokeless Tobacco Never Used  Tobacco Comment   quit smoking in 1985     Counseling given: Not Answered Comment: quit smoking in 1985   Clinical Intake:  Pre-visit preparation completed: Yes  Pain : No/denies pain Pain Score: 0-No pain     Nutritional Status: BMI > 30  Obese Nutritional Risks: None Diabetes: No  How often do you need to have someone help you when you read instructions, pamphlets, or other written materials from your doctor or pharmacy?: 1 - Never  Interpreter Needed?: No  Information entered by :: MMarkoski, LPN  Past Medical History:  Diagnosis Date  .  Anemia   . Anxiety   . Depression   . GERD (gastroesophageal reflux disease)   . Hypertension   . Thyroid disease   . Tremor, unspecified    throat tremor- diagnosed at [place]   Past Surgical History:  Procedure Laterality Date  . ABDOMINAL HYSTERECTOMY  1983  . ABDOMINAL HYSTERECTOMY    . CATARACT EXTRACTION, BILATERAL  08/2015  . cataract removed    . FOOT SURGERY    . HAND SURGERY    . INNER EAR SURGERY    . LASIK  11/2015  . MIDDLE EAR SURGERY    . THYROIDECTOMY  10/1988  . THYROIDECTOMY     Family History  Problem Relation Age of Onset  . Cancer Mother        Lung and brain cancer. Died from Non- Hodgkin's Lymphoma  . Lung cancer Mother   . Brain cancer Mother   . Hypothyroidism Son   . Stroke Father   . Hypertension Father   . Alcohol abuse Sister   . Diabetes Sister   . COPD Brother   . Prostate cancer Paternal Grandfather   . Alcohol abuse Brother   . Diabetes Brother   . Thyroid disease Son   . Arthritis Son   . Hashimoto's thyroiditis Son   . Breast cancer Maternal Grandmother   . Ovarian cancer Maternal Grandmother   . Colon cancer Neg Hx    Social History   Socioeconomic History  . Marital status: Married    Spouse name: Michae Kava  . Number of children: 2  . Years of education: Not  on file  . Highest education level: Some college, no degree  Occupational History  . Not on file  Social Needs  . Financial resource strain: Not hard at all  . Food insecurity:    Worry: Never true    Inability: Never true  . Transportation needs:    Medical: No    Non-medical: No  Tobacco Use  . Smoking status: Former Smoker    Last attempt to quit: 03/03/1983    Years since quitting: 35.4  . Smokeless tobacco: Never Used  . Tobacco comment: quit smoking in 1985  Substance and Sexual Activity  . Alcohol use: Yes    Alcohol/week: 3.0 standard drinks    Types: 3 Glasses of wine per week  . Drug use: No  . Sexual activity: Not Currently    Birth  control/protection: Surgical  Lifestyle  . Physical activity:    Days per week: 0 days    Minutes per session: 0 min  . Stress: To some extent  Relationships  . Social connections:    Talks on phone: Patient refused    Gets together: Patient refused    Attends religious service: Patient refused    Active member of club or organization: Patient refused    Attends meetings of clubs or organizations: Patient refused    Relationship status: Patient refused  Other Topics Concern  . Not on file  Social History Narrative           Outpatient Encounter Medications as of 07/19/2018  Medication Sig  . acetaminophen (TYLENOL) 500 MG tablet Take 500 mg by mouth every 6 (six) hours as needed.  Marland Kitchen albuterol (PROVENTIL HFA;VENTOLIN HFA) 108 (90 Base) MCG/ACT inhaler Inhale 2 puffs into the lungs every 6 (six) hours as needed for wheezing or shortness of breath.  . bisoprolol-hydrochlorothiazide (ZIAC) 5-6.25 MG tablet Take 1 tablet by mouth daily.  . DULoxetine (CYMBALTA) 60 MG capsule Take 1 capsule (60 mg total) by mouth daily.  . ferrous sulfate 325 (65 FE) MG tablet Take by mouth as needed. Reported on 03/06/2015  . levothyroxine (SYNTHROID, LEVOTHROID) 137 MCG tablet Take 1 tablet (137 mcg total) by mouth daily before breakfast.  . mirtazapine (REMERON) 30 MG tablet Take 1 tablet (30 mg total) by mouth at bedtime.  . Multiple Vitamin (MULTIVITAMIN) tablet Take 1 tablet by mouth daily. occasionally  . omeprazole (PRILOSEC) 40 MG capsule Take 1 capsule (40 mg total) by mouth daily.  . TURMERIC PO Take 500 mg by mouth daily. Unsure dose  . LORazepam (ATIVAN) 0.5 MG tablet Take 1 tablet (0.5 mg total) by mouth at bedtime. (Patient not taking: Reported on 07/19/2018)   No facility-administered encounter medications on file as of 07/19/2018.     Activities of Daily Living In your present state of health, do you have any difficulty performing the following activities: 07/19/2018  Hearing? N  Vision?  N  Difficulty concentrating or making decisions? N  Walking or climbing stairs? N  Dressing or bathing? N  Doing errands, shopping? N  Preparing Food and eating ? N  Using the Toilet? N  In the past six months, have you accidently leaked urine? Y  Comment Had bladder tact over 10 years ago.   Do you have problems with loss of bowel control? N  Managing your Medications? N  Managing your Finances? N  Housekeeping or managing your Housekeeping? N  Some recent data might be hidden    Patient Care Team: Jerrol Banana., MD  as PCP - General (Family Medicine) Vevelyn Royals, MD as Consulting Physician (Ophthalmology) Defrancesco, Alanda Slim, MD as Consulting Physician (Obstetrics and Gynecology) Brita Romp Dionne Bucy, MD as Consulting Physician (Family Medicine) Manya Silvas, MD (Gastroenterology) Jannet Mantis, MD (Dermatology)    Assessment:   This is a routine wellness examination for [facility].  Exercise Activities and Dietary recommendations Current Exercise Habits: The patient does not participate in regular exercise at present, Exercise limited by: None identified  Goals    . DIET - INCREASE WATER INTAKE     Recommend to cut back on caffeinated drinks and increase water intake to 4-6 glasses a day.     . Increase water intake     Recommend drinking 4-5 glasses of water a day.       Fall Risk: Fall Risk  07/19/2018 10/01/2017 08/13/2017 07/14/2016 03/06/2015  Falls in the past year? 0 No No No No  Comment - Emmi Telephone Survey: data to providers prior to load - - -    FALL RISK PREVENTION PERTAINING TO THE HOME:  Any stairs in or around the home? Yes  If so, are there any without handrails? Yes   Home free of loose throw rugs in walkways, pet beds, electrical cords, etc? Yes  Adequate lighting in your home to reduce risk of falls? Yes   ASSISTIVE DEVICES UTILIZED TO PREVENT FALLS:  Life alert? No  Use of a cane, walker or w/c? No  Grab bars in the  bathroom? No  Shower chair or bench in shower? No  Elevated toilet seat or a handicapped toilet? Yes    TIMED UP AND GO:  Was the test performed? No .    Depression Screen PHQ 2/9 Scores 07/19/2018 07/15/2017 07/14/2016 07/14/2016  PHQ - 2 Score 0 1 0 2  PHQ- 9 Score - - - 6     Cognitive Function: Declined today.         Immunization History  Administered Date(s) Administered  . Influenza, High Dose Seasonal PF 12/14/2016, 11/17/2017  . Influenza-Unspecified 11/30/2012, 12/17/2014  . Pneumococcal Conjugate-13 08/13/2017  . Pneumococcal Polysaccharide-23 10/27/2010  . Tdap 10/27/2010    Qualifies for Shingles Vaccine? Yes . Due for Shingrix. Education has been provided regarding the importance of this vaccine. Pt has been advised to call insurance company to determine out of pocket expense. Advised may also receive vaccine at local pharmacy or Health Dept. Verbalized acceptance and understanding.  Tdap: Up to date  Flu Vaccine: Up to date  Pneumococcal Vaccine: Up to date  Screening Tests Health Maintenance  Topic Date Due  . DEXA SCAN  07/18/2011  . INFLUENZA VACCINE  10/01/2018  . MAMMOGRAM  09/01/2019  . TETANUS/TDAP  10/26/2020  . COLONOSCOPY  08/03/2021  . Hepatitis C Screening  Completed  . PNA vac Low Risk Adult  Completed    Cancer Screenings:  Colorectal Screening: Completed 08/04/11. Repeat every 10 years.  Mammogram: Completed 08/31/17.   Bone Density: Completed 07/17/09. Results reflect OSTEOPOROSIS. Repeat every 2 years. Ordered today. Pt provided with contact info and advised to call to schedule appt. Pt aware the office will call re: appt.  Lung Cancer Screening: (Low Dose CT Chest recommended if Age 75-80 years, 30 pack-year currently smoking OR have quit w/in 15years.) does not qualify.   Additional Screening:  Hepatitis C Screening: Up to date  Vision Screening: Recommended annual ophthalmology exams for early detection of glaucoma and  other disorders of the eye.  Dental Screening: Recommended  annual dental exams for proper oral hygiene  Community Resource Referral:  CRR required this visit?  No       Plan:  I have personally reviewed and addressed the Medicare Annual Wellness questionnaire and have noted the following in the patient's chart:  A. Medical and social history B. Use of alcohol, tobacco or illicit drugs  C. Current medications and supplements D. Functional ability and status E.  Nutritional status F.  Physical activity G. Advance directives H. List of other physicians I.  Hospitalizations, surgeries, and ER visits in previous 12 months J.  [place] such as hearing and vision if needed, cognitive and depression L. Referrals and appointments   In addition, I have reviewed and discussed with patient certain preventive protocols, quality metrics, and best practice recommendations. A written personalized care plan for preventive services as well as general preventive health recommendations were provided to patient. Nurse Health Advisor  Signed,    Asal Teas [place], [place]  [phone]/[id number] Nurse Health Advisor   Nurse Notes: None.

## 2018-07-19 NOTE — Patient Instructions (Signed)
Jasmine Rich , Thank you for taking time to come for your Medicare Wellness Visit. I appreciate your ongoing commitment to your health goals. Please review the following plan we discussed and let me know if I can assist you in the future.   Screening recommendations/referrals: Colonoscopy: Up to date, due 062023 Mammogram: Up to date, due 08/2019 Bone Density: Ordered today. Pt was advised she will receive a call to set apt up.  Recommended yearly ophthalmology/optometry visit for glaucoma screening and checkup Recommended yearly dental visit for hygiene and checkup  Vaccinations: Influenza vaccine: Up to date Pneumococcal vaccine: Completed series Tdap vaccine: Up to date, due 09/2020 Shingles vaccine: Pt declines today.     Advanced directives: Advance directive discussed with you today. I have provided a copy for you to complete at home and have notarized. Once this is complete please bring a copy in to our office so we can scan it into your chart.  Conditions/risks identified: Recommend to continue to increase water intake a a decrease tea consumption.   Next appointment: 11/03/18 @ 10:20 AM with Dr Rosanna Randy. Declined scheduling an AWV for 2021 at this time.    Preventive Care 38 Years and Older, Female Preventive care refers to lifestyle choices and visits with your health care provider that can promote health and wellness. What does preventive care include?  A yearly physical exam. This is also called an annual well check.  Dental exams once or twice a year.  Routine eye exams. Ask your health care provider how often you should have your eyes checked.  Personal lifestyle choices, including:  Daily care of your teeth and gums.  Regular physical activity.  Eating a healthy diet.  Avoiding tobacco and drug use.  Limiting alcohol use.  Practicing safe sex.  Taking low-dose aspirin every day.  Taking vitamin and mineral supplements as recommended by your health care  provider. What happens during an annual well check? The services and screenings done by your health care provider during your annual well check will depend on your age, overall health, lifestyle risk factors, and family history of disease. Counseling  Your health care provider may ask you questions about your:  Alcohol use.  Tobacco use.  Drug use.  Emotional well-being.  Home and relationship well-being.  Sexual activity.  Eating habits.  History of falls.  Memory and ability to understand (cognition).  Work and work Statistician.  Reproductive health. Screening  You may have the following tests or measurements:  Height, weight, and BMI.  Blood pressure.  Lipid and cholesterol levels. These may be checked every 5 years, or more frequently if you are over 75 years old.  Skin check.  Lung cancer screening. You may have this screening every year starting at age 47 if you have a 30-pack-year history of smoking and currently smoke or have quit within the past 15 years.  Fecal occult blood test (FOBT) of the stool. You may have this test every year starting at age 49.  Flexible sigmoidoscopy or colonoscopy. You may have a sigmoidoscopy every 5 years or a colonoscopy every 10 years starting at age 77.  Hepatitis C blood test.  Hepatitis B blood test.  Sexually transmitted disease (STD) testing.  Diabetes screening. This is done by checking your blood sugar (glucose) after you have not eaten for a while (fasting). You may have this done every 1-3 years.  Bone density scan. This is done to screen for osteoporosis. You may have this done starting at age  65.  Mammogram. This may be done every 1-2 years. Talk to your health care provider about how often you should have regular mammograms. Talk with your health care provider about your test results, treatment options, and if necessary, the need for more tests. Vaccines  Your health care provider may recommend certain  vaccines, such as:  Influenza vaccine. This is recommended every year.  Tetanus, diphtheria, and acellular pertussis (Tdap, Td) vaccine. You may need a Td booster every 10 years.  Zoster vaccine. You may need this after age 18.  Pneumococcal 13-valent conjugate (PCV13) vaccine. One dose is recommended after age 8.  Pneumococcal polysaccharide (PPSV23) vaccine. One dose is recommended after age 30. Talk to your health care provider about which screenings and vaccines you need and how often you need them. This information is not intended to replace advice given to you by your health care provider. Make sure you discuss any questions you have with your health care provider. Document Released: 03/15/2015 Document Revised: 11/06/2015 Document Reviewed: 12/18/2014 Elsevier Interactive Patient Education  2017 [place] Prevention in the Home Falls can cause injuries. They can happen to people of all ages. There are many things you can do to make your home safe and to help prevent falls. What can I do on the outside of my home?  Regularly fix the edges of walkways and driveways and fix any cracks.  Remove anything that might make you trip as you walk through a door, such as a raised step or threshold.  Trim any bushes or trees on the path to your home.  Use bright outdoor lighting.  Clear any walking paths of anything that might make someone trip, such as rocks or tools.  Regularly check to see if handrails are loose or broken. Make sure that both sides of any steps have handrails.  Any raised decks and porches should have guardrails on the edges.  Have any leaves, snow, or ice cleared regularly.  Use sand or salt on walking paths during winter.  Clean up any spills in your garage right away. This includes oil or grease spills. What can I do in the bathroom?  Use night lights.  Install grab bars by the toilet and in the tub and shower. Do not use towel bars as grab  bars.  Use non-skid mats or decals in the tub or shower.  If you need to sit down in the shower, use a plastic, non-slip stool.  Keep the floor dry. Clean up any water that spills on the floor as soon as it happens.  Remove soap buildup in the tub or shower regularly.  Attach bath mats securely with double-sided non-slip rug tape.  Do not have throw rugs and other things on the floor that can make you trip. What can I do in the bedroom?  Use night lights.  Make sure that you have a light by your bed that is easy to reach.  Do not use any sheets or blankets that are too big for your bed. They should not hang down onto the floor.  Have a firm chair that has side arms. You can use this for support while you get dressed.  Do not have throw rugs and other things on the floor that can make you trip. What can I do in the kitchen?  Clean up any spills right away.  Avoid walking on wet floors.  Keep items that you use a lot in easy-to-reach places.  If you need  to reach something above you, use a strong step stool that has a grab bar.  Keep electrical cords out of the way.  Do not use floor polish or wax that makes floors slippery. If you must use wax, use non-skid floor wax.  Do not have throw rugs and other things on the floor that can make you trip. What can I do with my stairs?  Do not leave any items on the stairs.  Make sure that there are handrails on both sides of the stairs and use them. Fix handrails that are broken or loose. Make sure that handrails are as long as the stairways.  Check any carpeting to make sure that it is firmly attached to the stairs. Fix any carpet that is loose or worn.  Avoid having throw rugs at the top or bottom of the stairs. If you do have throw rugs, attach them to the floor with carpet tape.  Make sure that you have a light switch at the top of the stairs and the bottom of the stairs. If you do not have them, ask someone to add them for  you. What else can I do to help prevent falls?  Wear shoes that:  Do not have high heels.  Have rubber bottoms.  Are comfortable and fit you well.  Are closed at the toe. Do not wear sandals.  If you use a stepladder:  Make sure that it is fully opened. Do not climb a closed stepladder.  Make sure that both sides of the stepladder are locked into place.  Ask someone to hold it for you, if possible.  Clearly mark and make sure that you can see:  Any grab bars or handrails.  First and last steps.  Where the edge of each step is.  Use tools that help you move around (mobility aids) if they are needed. These include:  Canes.  Walkers.  Scooters.  Crutches.  Turn on the lights when you go into a dark area. Replace any light bulbs as soon as they burn out.  Set up your furniture so you have a clear path. Avoid moving your furniture around.  If any of your floors are uneven, fix them.  If there are any pets around you, be aware of where they are.  Review your medicines with your doctor. Some medicines can make you feel dizzy. This can increase your chance of falling. Ask your doctor what other things that you can do to help prevent falls. This information is not intended to replace advice given to you by your health care provider. Make sure you discuss any questions you have with your health care provider. Document Released: 12/13/2008 Document Revised: 07/25/2015 Document Reviewed: 03/23/2014 Elsevier Interactive Patient Education  2017 [employer].

## 2018-08-02 DIAGNOSIS — R51 Headache: Secondary | ICD-10-CM | POA: Diagnosis not present

## 2018-08-02 DIAGNOSIS — M5033 Other cervical disc degeneration, cervicothoracic region: Secondary | ICD-10-CM | POA: Diagnosis not present

## 2018-08-02 DIAGNOSIS — M9903 Segmental and somatic dysfunction of lumbar region: Secondary | ICD-10-CM | POA: Diagnosis not present

## 2018-08-02 DIAGNOSIS — M9901 Segmental and somatic dysfunction of cervical region: Secondary | ICD-10-CM | POA: Diagnosis not present

## 2018-08-17 ENCOUNTER — Other Ambulatory Visit: Payer: Self-pay | Admitting: Family Medicine

## 2018-08-17 DIAGNOSIS — Z1231 Encounter for screening mammogram for malignant neoplasm of breast: Secondary | ICD-10-CM

## 2018-08-24 ENCOUNTER — Ambulatory Visit

## 2018-08-30 DIAGNOSIS — M5033 Other cervical disc degeneration, cervicothoracic region: Secondary | ICD-10-CM | POA: Diagnosis not present

## 2018-08-30 DIAGNOSIS — R51 Headache: Secondary | ICD-10-CM | POA: Diagnosis not present

## 2018-08-30 DIAGNOSIS — M9903 Segmental and somatic dysfunction of lumbar region: Secondary | ICD-10-CM | POA: Diagnosis not present

## 2018-08-30 DIAGNOSIS — M9901 Segmental and somatic dysfunction of cervical region: Secondary | ICD-10-CM | POA: Diagnosis not present

## 2018-09-07 ENCOUNTER — Other Ambulatory Visit: Payer: Self-pay | Admitting: Family Medicine

## 2018-09-07 DIAGNOSIS — E039 Hypothyroidism, unspecified: Secondary | ICD-10-CM

## 2018-09-07 MED ORDER — DULOXETINE HCL 60 MG PO CPEP: 60.0000 mg | ORAL_CAPSULE | Freq: Every day | ORAL | 0 refills | Status: DC

## 2018-09-07 NOTE — Telephone Encounter (Signed)
Patient is requesting a refill on the following medication  levothyroxine (SYNTHROID, LEVOTHROID) 137 MCG tablet   She would like this sent to [facility] by mail.  She only has about a weeks worth of this medication left.

## 2018-09-07 NOTE — Telephone Encounter (Signed)
Patient was advised and states she will follow-up with Dr. Rosanna Randy at her next appointment.

## 2018-09-07 NOTE — Telephone Encounter (Signed)
Patient is requesting a refill on the following medication  DULoxetine (CYMBALTA) 60 MG capsule    She is a Dr. Rosanna Randy patient but states that he told her that Dr. B would need to refill this since she prescribed it originally.  She would like it sent to [facility] by mail pharmacy.

## 2018-09-07 NOTE — Telephone Encounter (Signed)
Rx sent. Please advise her to f/u on anxiety at next upcoming appt with Dr Rosanna Randy (PCP).

## 2018-09-08 MED ORDER — LEVOTHYROXINE SODIUM 137 MCG PO TABS: 137.0000 ug | ORAL_TABLET | Freq: Every day | ORAL | 1 refills | Status: DC

## 2018-09-08 NOTE — Telephone Encounter (Signed)
Medication sent into the pharmacy. 

## 2018-09-27 ENCOUNTER — Ambulatory Visit
Admission: RE | Admit: 2018-09-27 | Discharge: 2018-09-27 | Disposition: A | Discharge status: Home / Self Care | Payer: Medicare Other | Source: Ambulatory Visit | Attending: Family Medicine | Admitting: Family Medicine

## 2018-09-27 ENCOUNTER — Other Ambulatory Visit: Payer: Self-pay

## 2018-09-27 DIAGNOSIS — Z1231 Encounter for screening mammogram for malignant neoplasm of breast: Secondary | ICD-10-CM | POA: Insufficient documentation

## 2018-09-27 DIAGNOSIS — E2839 Other primary ovarian failure: Secondary | ICD-10-CM | POA: Diagnosis not present

## 2018-09-27 DIAGNOSIS — M810 Age-related osteoporosis without current pathological fracture: Secondary | ICD-10-CM | POA: Diagnosis not present

## 2018-09-27 DIAGNOSIS — M8588 Other specified disorders of bone density and structure, other site: Secondary | ICD-10-CM | POA: Diagnosis not present

## 2018-09-29 ENCOUNTER — Telehealth: Payer: Self-pay

## 2018-09-29 NOTE — Telephone Encounter (Signed)
-----   Message from Jerrol Banana., MD sent at 09/29/2018  2:26 PM EDT ----- If pt has never been on alendronoat for osteoporosis would use 70mg  weekly--#[id number],11rf

## 2018-09-29 NOTE — Telephone Encounter (Signed)
Tried calling patient, no answer. Will try again later.  

## 2018-10-10 NOTE — Telephone Encounter (Signed)
Unable to contact patient. Will mail a letter.

## 2018-10-25 DIAGNOSIS — M9903 Segmental and somatic dysfunction of lumbar region: Secondary | ICD-10-CM | POA: Diagnosis not present

## 2018-10-25 DIAGNOSIS — M5033 Other cervical disc degeneration, cervicothoracic region: Secondary | ICD-10-CM | POA: Diagnosis not present

## 2018-10-25 DIAGNOSIS — M9901 Segmental and somatic dysfunction of cervical region: Secondary | ICD-10-CM | POA: Diagnosis not present

## 2018-10-25 DIAGNOSIS — R51 Headache: Secondary | ICD-10-CM | POA: Diagnosis not present

## 2018-11-03 ENCOUNTER — Ambulatory Visit (INDEPENDENT_AMBULATORY_CARE_PROVIDER_SITE_OTHER): Payer: Medicare Other | Admitting: Family Medicine

## 2018-11-03 ENCOUNTER — Other Ambulatory Visit: Payer: Self-pay

## 2018-11-03 ENCOUNTER — Encounter: Payer: Self-pay | Admitting: Family Medicine

## 2018-11-03 VITALS — BP 111/75 | HR 82 | Temp 98.2°F | Resp 18 | Wt 202.0 lb

## 2018-11-03 DIAGNOSIS — E2839 Other primary ovarian failure: Secondary | ICD-10-CM

## 2018-11-03 DIAGNOSIS — E7800 Pure hypercholesterolemia, unspecified: Secondary | ICD-10-CM | POA: Diagnosis not present

## 2018-11-03 DIAGNOSIS — Z6834 Body mass index (BMI) 34.0-34.9, adult: Secondary | ICD-10-CM

## 2018-11-03 DIAGNOSIS — E6609 Other obesity due to excess calories: Secondary | ICD-10-CM

## 2018-11-03 DIAGNOSIS — R079 Chest pain, unspecified: Secondary | ICD-10-CM

## 2018-11-03 DIAGNOSIS — E039 Hypothyroidism, unspecified: Secondary | ICD-10-CM

## 2018-11-03 DIAGNOSIS — F419 Anxiety disorder, unspecified: Secondary | ICD-10-CM | POA: Diagnosis not present

## 2018-11-03 DIAGNOSIS — N390 Urinary tract infection, site not specified: Secondary | ICD-10-CM | POA: Diagnosis not present

## 2018-11-03 DIAGNOSIS — R7303 Prediabetes: Secondary | ICD-10-CM

## 2018-11-03 DIAGNOSIS — I10 Essential (primary) hypertension: Secondary | ICD-10-CM

## 2018-11-03 LAB — POCT URINALYSIS DIPSTICK
Bilirubin, UA: NEGATIVE
Glucose, UA: NEGATIVE
Ketones, UA: NEGATIVE
Leukocytes, UA: NEGATIVE
Nitrite, UA: NEGATIVE
Protein, UA: NEGATIVE
Spec Grav, UA: 1.025 (ref 1.010–1.025)
Urobilinogen, UA: 0.2 E.U./dL
pH, UA: 6 (ref 5.0–8.0)

## 2018-11-03 LAB — EKG 12-LEAD

## 2018-11-03 MED ORDER — ESTRADIOL 0.5 MG PO TABS: 0.5000 mg | ORAL_TABLET | Freq: Every day | ORAL | 2 refills | Status: DC

## 2018-11-03 NOTE — Patient Instructions (Signed)
Stop taking Evista
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
## 2018-11-04 DIAGNOSIS — E039 Hypothyroidism, unspecified: Secondary | ICD-10-CM | POA: Diagnosis not present

## 2018-11-04 DIAGNOSIS — E7800 Pure hypercholesterolemia, unspecified: Secondary | ICD-10-CM | POA: Diagnosis not present

## 2018-11-04 DIAGNOSIS — I10 Essential (primary) hypertension: Secondary | ICD-10-CM | POA: Diagnosis not present

## 2018-11-05 LAB — CBC WITH DIFFERENTIAL/PLATELET
Basophils Absolute: 0 10*3/uL (ref 0.0–0.2)
Basos: 0 %
EOS (ABSOLUTE): 0.2 10*3/uL (ref 0.0–0.4)
Eos: 2 %
Hematocrit: 42.4 % (ref 34.0–46.6)
Hemoglobin: 14 g/dL (ref 11.1–15.9)
Immature Grans (Abs): 0 10*3/uL (ref 0.0–0.1)
Immature Granulocytes: 0 %
Lymphocytes Absolute: 2.5 10*3/uL (ref 0.7–3.1)
Lymphs: 30 %
MCH: 30.2 pg (ref 26.6–33.0)
MCHC: 33 g/dL (ref 31.5–35.7)
MCV: 91 fL (ref 79–97)
Monocytes Absolute: 1.1 10*3/uL — ABNORMAL HIGH (ref 0.1–0.9)
Monocytes: 13 %
Neutrophils Absolute: 4.5 10*3/uL (ref 1.4–7.0)
Neutrophils: 55 %
Platelets: 348 10*3/uL (ref 150–450)
RBC: 4.64 x10E6/uL (ref 3.77–5.28)
RDW: 13.5 % (ref 11.7–15.4)
WBC: 8.2 10*3/uL (ref 3.4–10.8)

## 2018-11-05 LAB — COMP. METABOLIC PANEL (12)
AST: 20 IU/L (ref 0–40)
Albumin/Globulin Ratio: 2 (ref 1.2–2.2)
Albumin: 4.3 g/dL (ref 3.7–4.7)
Alkaline Phosphatase: 101 IU/L (ref 39–117)
BUN/Creatinine Ratio: 15 (ref 12–28)
BUN: 17 mg/dL (ref 8–27)
Bilirubin Total: 0.4 mg/dL (ref 0.0–1.2)
Calcium: 9.6 mg/dL (ref 8.7–10.3)
Chloride: 100 mmol/L (ref 96–106)
Creatinine, Ser: 1.14 mg/dL — ABNORMAL HIGH (ref 0.57–1.00)
GFR calc Af Amer: 55 mL/min/{1.73_m2} — ABNORMAL LOW (ref >59)
GFR calc non Af Amer: 48 mL/min/{1.73_m2} — ABNORMAL LOW (ref >59)
Globulin, Total: 2.1 g/dL (ref 1.5–4.5)
Glucose: 131 mg/dL — ABNORMAL HIGH (ref 65–99)
Potassium: 5 mmol/L (ref 3.5–5.2)
Sodium: 139 mmol/L (ref 134–144)
Total Protein: 6.4 g/dL (ref 6.0–8.5)

## 2018-11-05 LAB — LIPID PANEL
Chol/HDL Ratio: 6.5 ratio — ABNORMAL HIGH (ref 0.0–4.4)
Cholesterol, Total: 207 mg/dL — ABNORMAL HIGH (ref 100–199)
HDL: 32 mg/dL — ABNORMAL LOW (ref >39)
LDL Chol Calc (NIH): 131 mg/dL — ABNORMAL HIGH (ref 0–99)
Triglycerides: 247 mg/dL — ABNORMAL HIGH (ref 0–149)
VLDL Cholesterol Cal: 44 mg/dL — ABNORMAL HIGH (ref 5–40)

## 2018-11-05 LAB — TSH: TSH: 2.3 u[IU]/mL (ref 0.450–4.500)

## 2018-11-14 ENCOUNTER — Telehealth: Payer: Self-pay

## 2018-11-14 NOTE — Telephone Encounter (Signed)
-----   Message from Jerrol Banana., MD sent at 11/10/2018  8:16 AM EDT ----- Labs stable except pt now early DM with fasting glucose 131--refer to [facility] for teaching and RTC 3 months--I think she already has earlier appt.

## 2018-11-14 NOTE — Telephone Encounter (Signed)
Patient notified of lab results. She states that she would like to think about the referral before it is placed. She will call back and let us know.

## 2018-12-05 DIAGNOSIS — M9901 Segmental and somatic dysfunction of cervical region: Secondary | ICD-10-CM | POA: Diagnosis not present

## 2018-12-05 DIAGNOSIS — M9903 Segmental and somatic dysfunction of lumbar region: Secondary | ICD-10-CM | POA: Diagnosis not present

## 2018-12-05 DIAGNOSIS — M5033 Other cervical disc degeneration, cervicothoracic region: Secondary | ICD-10-CM | POA: Diagnosis not present

## 2018-12-05 DIAGNOSIS — R519 Headache, unspecified: Secondary | ICD-10-CM | POA: Diagnosis not present

## 2018-12-06 DIAGNOSIS — Z23 Encounter for immunization: Secondary | ICD-10-CM | POA: Diagnosis not present

## 2018-12-26 ENCOUNTER — Other Ambulatory Visit: Payer: Self-pay | Admitting: Family Medicine

## 2018-12-26 NOTE — Telephone Encounter (Signed)
Pt requesting medication refill of DULoxetine (CYMBALTA) 60 MG capsule sent to [facility] .  States she is out of medication.

## 2018-12-26 NOTE — Telephone Encounter (Signed)
Please review. Thanks!  

## 2018-12-27 MED ORDER — DULOXETINE HCL 60 MG PO CPEP: 60.0000 mg | ORAL_CAPSULE | Freq: Every day | ORAL | 3 refills | Status: DC

## 2019-01-04 NOTE — Progress Notes (Signed)
Patient: Jasmine Rich Female    DOB: 03-Mar-1945   73 y.o.   MRN: [id number] Visit Date: 01/05/2019  Today's Provider: [name], MD   Chief Complaint  Patient presents with  . Follow-up  . Diabetes  . Anxiety   Subjective:     HPI   Estrogen deficiency From 11/03/2018-Started low dose Estradiol RTC 2 months.Risk/benefits discussed. Advised to stop Evista. estradiol (ESTRACE) 0.5 MG tablet; Take 1 tablet (0.5 mg total) by mouth daily.   01/05/2019 - Still having Hot Flashes She might be feeling a little better.  Hypercholesteremia From 11/03/2018-labs stable.  Has started eating more fruits and vegetables  Essential (primary) hypertension From 11/03/2018-labs stable. On Ziac.   Acquired hypothyroidism From 11/03/2018-labs stable. On synthroid.   Prediabetes From 11/03/2018-Labs stable except pt now early DM with fasting glucose 131--referred to [facility] for teaching and RTC 3 months. - 01/05/2019 - has not gone to [facility] does not wish to do so.  Chronic Anxiety From 11/03/2018-On duloxetine. More than 50% 45  minute visit spent in counseling or coordination of care 01/05/2019 -  GAD score = 7  PHQ9 - 6 Meds are helping  Allergies  Allergen Reactions  . Codeine Other (See Comments)    Abdominal pain and nausea     Current Outpatient Medications:  .  acetaminophen (TYLENOL) 500 MG tablet, Take 500 mg by mouth every 6 (six) hours as needed., Disp: , Rfl:  .  albuterol (PROVENTIL HFA;VENTOLIN HFA) 108 (90 Base) MCG/ACT inhaler, Inhale 2 puffs into the lungs every 6 (six) hours as needed for wheezing or shortness of breath., Disp: 1 Inhaler, Rfl: 2 .  bisoprolol-hydrochlorothiazide (ZIAC) 5-6.25 MG tablet, Take 1 tablet by mouth daily., Disp: 90 tablet, Rfl: 3 .  DULoxetine (CYMBALTA) 60 MG capsule, Take 1 capsule (60 mg total) by mouth daily., Disp: 90 capsule, Rfl: 3 .  estradiol (ESTRACE) 0.5 MG tablet, Take 1 tablet (0.5 mg total) by mouth daily., Disp: 30  tablet, Rfl: 2 .  levothyroxine (SYNTHROID) 137 MCG tablet, Take 1 tablet (137 mcg total) by mouth daily before breakfast., Disp: 90 tablet, Rfl: 1 .  mirtazapine (REMERON) 30 MG tablet, Take 1 tablet (30 mg total) by mouth at bedtime., Disp: 90 tablet, Rfl: 3 .  omeprazole (PRILOSEC) 40 MG capsule, Take 1 capsule (40 mg total) by mouth daily., Disp: 90 capsule, Rfl: 3 .  ferrous sulfate 325 (65 FE) MG tablet, Take by mouth as needed. Reported on 03/06/2015, Disp: , Rfl:  .  LORazepam (ATIVAN) 0.5 MG tablet, Take 1 tablet (0.5 mg total) by mouth at bedtime. (Patient not taking: Reported on 01/05/2019), Disp: 90 tablet, Rfl: 1 .  Multiple Vitamin (MULTIVITAMIN) tablet, Take 1 tablet by mouth daily. occasionally, Disp: , Rfl:  .  TURMERIC PO, Take 500 mg by mouth daily. Unsure dose, Disp: , Rfl:  .  valACYclovir (VALTREX) 1000 MG tablet, SMARTSIG:1 Tablet(s) By Mouth Every 12 Hours, Disp: , Rfl:   Review of Systems  Constitutional: Negative.  Negative for appetite change, chills, fatigue and fever.  Respiratory: Negative for chest tightness and shortness of breath.   Cardiovascular: Negative for chest pain and palpitations.  Gastrointestinal: Negative for abdominal pain, nausea and vomiting.  Musculoskeletal: Positive for arthralgias.  Skin: Negative.   Neurological: Negative for dizziness and weakness.  Hematological: Negative.   Psychiatric/Behavioral: The patient is nervous/anxious.     Social History   Tobacco Use  . Smoking status: Former  Smoker    Quit date: 03/03/1983    Years since quitting: 35.8  . Smokeless tobacco: Never Used  . Tobacco comment: quit smoking in 1985  Substance Use Topics  . Alcohol use: Yes    Alcohol/week: 3.0 standard drinks    Types: 3 Glasses of wine per week      Objective:   BP 102/60   Pulse 81   Temp (!) 97.2 F (36.2 C) (Temporal)   Resp 20   Ht 5\' 3"  (1.6 m)   Wt 202 lb (91.6 kg)   SpO2 97%   BMI 35.78 kg/m  Vitals:   01/05/19 1058  BP:  102/60  Pulse: 81  Resp: 20  Temp: (!) 97.2 F (36.2 C)  TempSrc: Temporal  SpO2: 97%  Weight: 202 lb (91.6 kg)  Height: 5\' 3"  (1.6 m)  Body mass index is 35.78 kg/m.   Physical Exam Vitals signs reviewed.  Constitutional:      Appearance: She is well-developed.  HENT:     Head: Normocephalic and atraumatic.     Right Ear: External ear normal.     Left Ear: External ear normal.     Nose: Nose normal.  Eyes:     General: No scleral icterus.    Conjunctiva/sclera: Conjunctivae normal.  Neck:     Thyroid: No thyromegaly.  Cardiovascular:     Rate and Rhythm: Normal rate and regular rhythm.     Heart sounds: Normal heart sounds.  Pulmonary:     Effort: Pulmonary effort is normal.     Breath sounds: Normal breath sounds.  Chest:     Breasts:        Right: Normal.        Left: Normal.  Abdominal:     Palpations: Abdomen is soft.  Skin:    General: Skin is warm and dry.  Neurological:     General: No focal deficit present.     Mental Status: She is alert and oriented to person, place, and time.  Psychiatric:        Mood and Affect: Mood normal.        Behavior: Behavior normal.        Thought Content: Thought content normal.        Judgment: Judgment normal.      No results found for any visits on 01/05/19.     Assessment & Plan    1. Prediabetes    2. Anxiety   3. Female climacteric state   4. Estrogen deficiency Increase dose--RTC 2 months--risks discussed with pt. Would stop if this dose not helpful. - estradiol (ESTRACE) 0.5 MG tablet; Take 2 tablets (1 mg total) by mouth daily.  Dispense: 90 tablet; Refill: 2  5. Class 1 obesity due to excess calories with serious comorbidity and body mass index (BMI) of 34.0 to 34.9 in adult Lifestyle stressed.  6. Gastroesophageal reflux disease with esophagitis without hemorrhage  7.HTN  8.HLD    [name], MD  [facility]

## 2019-01-05 ENCOUNTER — Encounter: Payer: Self-pay | Admitting: Family Medicine

## 2019-01-05 ENCOUNTER — Ambulatory Visit (INDEPENDENT_AMBULATORY_CARE_PROVIDER_SITE_OTHER): Payer: Medicare Other | Admitting: Family Medicine

## 2019-01-05 ENCOUNTER — Other Ambulatory Visit: Payer: Self-pay

## 2019-01-05 VITALS — BP 102/60 | HR 81 | Temp 97.2°F | Resp 20 | Ht 63.0 in | Wt 202.0 lb

## 2019-01-05 DIAGNOSIS — Z6834 Body mass index (BMI) 34.0-34.9, adult: Secondary | ICD-10-CM | POA: Diagnosis not present

## 2019-01-05 DIAGNOSIS — E2839 Other primary ovarian failure: Secondary | ICD-10-CM | POA: Diagnosis not present

## 2019-01-05 DIAGNOSIS — R7303 Prediabetes: Secondary | ICD-10-CM

## 2019-01-05 DIAGNOSIS — E6609 Other obesity due to excess calories: Secondary | ICD-10-CM

## 2019-01-05 DIAGNOSIS — N951 Menopausal and female climacteric states: Secondary | ICD-10-CM

## 2019-01-05 DIAGNOSIS — K2100 Gastro-esophageal reflux disease with esophagitis, without bleeding: Secondary | ICD-10-CM | POA: Diagnosis not present

## 2019-01-05 DIAGNOSIS — F419 Anxiety disorder, unspecified: Secondary | ICD-10-CM

## 2019-01-05 MED ORDER — ESTRADIOL 0.5 MG PO TABS: 1.0000 mg | ORAL_TABLET | Freq: Every day | ORAL | 2 refills | Status: DC

## 2019-01-05 NOTE — Patient Instructions (Signed)
We are increasing your estradiol to 1mg  daily. Follow back up for your Shingles Vaccine.

## 2019-03-09 ENCOUNTER — Ambulatory Visit: Payer: Self-pay | Admitting: Family Medicine

## 2019-03-13 ENCOUNTER — Other Ambulatory Visit: Payer: Self-pay | Admitting: Family Medicine

## 2019-03-13 DIAGNOSIS — E039 Hypothyroidism, unspecified: Secondary | ICD-10-CM

## 2019-03-13 DIAGNOSIS — I10 Essential (primary) hypertension: Secondary | ICD-10-CM

## 2019-03-13 MED ORDER — BISOPROLOL-HYDROCHLOROTHIAZIDE 5-6.25 MG PO TABS: 1.0000 | ORAL_TABLET | Freq: Every day | ORAL | 3 refills | Status: DC

## 2019-03-13 MED ORDER — LEVOTHYROXINE SODIUM 137 MCG PO TABS: 137.0000 ug | ORAL_TABLET | Freq: Every day | ORAL | 1 refills | Status: DC

## 2019-03-13 MED ORDER — OMEPRAZOLE 40 MG PO CPDR: 40.0000 mg | DELAYED_RELEASE_CAPSULE | Freq: Every day | ORAL | 3 refills | Status: DC

## 2019-03-13 MED ORDER — MIRTAZAPINE 30 MG PO TABS: 30.0000 mg | ORAL_TABLET | Freq: Every day | ORAL | 3 refills | Status: DC

## 2019-03-13 MED ORDER — DULOXETINE HCL 60 MG PO CPEP: 60.0000 mg | ORAL_CAPSULE | Freq: Every day | ORAL | 3 refills | Status: DC

## 2019-03-13 NOTE — Telephone Encounter (Signed)
Requested Prescriptions  Pending Prescriptions Disp Refills  . levothyroxine (SYNTHROID) 137 MCG tablet 90 tablet 1    Sig: Take 1 tablet (137 mcg total) by mouth daily before breakfast.     Endocrinology:  Hypothyroid Agents Failed - 03/13/2019 11:45 AM      Failed - TSH needs to be rechecked within 3 months after an abnormal result. Refill until TSH is due.      Passed - TSH in normal range and within 360 days    TSH  Date Value Ref Range Status  11/04/2018 2.300 0.450 - 4.500 uIU/mL Final         Passed - Valid encounter within last 12 months    Recent Outpatient Visits          2 months ago [place] Jerrol Banana., MD   4 months ago Chest pain, unspecified type   [facility] Jerrol Banana., MD   1 year ago Need for influenza vaccination   [facility] Jerrol Banana., MD   1 year ago Encounter for annual physical exam   [facility] [place], Dionne Bucy, MD   1 year ago [place] Trinna Post, [place]      Future Appointments            In 1 week Jerrol Banana., MD [facility]           . omeprazole (PRILOSEC) 40 MG capsule 90 capsule 3    Sig: Take 1 capsule (40 mg total) by mouth daily.     Gastroenterology: Proton Pump Inhibitors Passed - 03/13/2019 11:45 AM      Passed - Valid encounter within last 12 months    Recent Outpatient Visits          2 months ago [place] Jerrol Banana., MD   4 months ago Chest pain, unspecified type   [facility] Jerrol Banana., MD   1 year ago Need for influenza vaccination   [facility] Jerrol Banana., MD   1 year ago Encounter for annual physical exam   [facility], Dionne Bucy, MD   1 year ago [place] Trinna Post, [place]      Future Appointments            In 1 week Jerrol Banana., MD [facility]           . bisoprolol-hydrochlorothiazide (ZIAC) 5-6.25 MG tablet 90 tablet 3    Sig: Take 1 tablet by mouth daily.     Cardiovascular: Beta Blocker + Diuretic Combos Failed - 03/13/2019 11:45 AM      Failed - Cr in normal range and within 180 days    Creatinine, Ser  Date Value Ref Range Status  11/04/2018 1.14 (H) 0.57 - 1.00 mg/dL Final         Passed - K in normal range and within 180 days    Potassium  Date Value Ref Range Status  11/04/2018 5.0 3.5 - 5.2 mmol/L Final         Passed - Na in normal range and within 180 days    Sodium  Date Value Ref Range Status  11/04/2018 139 134 - 144 mmol/L Final         Passed - Ca  in normal range and within 180 days    Calcium  Date Value Ref Range Status  11/04/2018 9.6 8.7 - 10.3 mg/dL Final         Passed - Patient is not pregnant      Passed - Last BP in normal range    BP Readings from Last 1 Encounters:  01/05/19 102/60         Passed - Last Heart Rate in normal range    Pulse Readings from Last 1 Encounters:  01/05/19 81         Passed - Valid encounter within last 6 months    Recent Outpatient Visits          2 months ago [place] Jerrol Banana., MD   4 months ago Chest pain, unspecified type   [facility] Jerrol Banana., MD   1 year ago Need for influenza vaccination   [facility] Jerrol Banana., MD   1 year ago Encounter for annual physical exam   [facility] [place], Dionne Bucy, MD   1 year ago [place] Trinna Post, [place]      Future Appointments            In 1 week Jerrol Banana., MD [facility]           . DULoxetine (CYMBALTA) 60 MG capsule 90 capsule 3    Sig: Take 1 capsule (60 mg total) by mouth daily.     Psychiatry:  Antidepressants - SNRI Passed - 03/13/2019 11:45 AM      Passed - Completed PHQ-2 or PHQ-9 in the last 360 days.      Passed - Last BP in normal range    BP Readings from Last 1 Encounters:  01/05/19 102/60         Passed - Valid encounter within last 6 months    Recent Outpatient Visits          2 months ago [place] Jerrol Banana., MD   4 months ago Chest pain, unspecified type   [facility] Jerrol Banana., MD   1 year ago Need for influenza vaccination   [facility] Jerrol Banana., MD   1 year ago Encounter for annual physical exam   [facility] [place], Dionne Bucy, MD   1 year ago [place], [place], [place]      Future Appointments            In 1 week Jerrol Banana., MD [facility]           . mirtazapine (REMERON) 30 MG tablet 90 tablet 3    Sig: Take 1 tablet (30 mg total) by mouth at bedtime.     Psychiatry: Antidepressants - mirtazapine Failed - 03/13/2019 11:45 AM      Failed - ALT in normal range and within 360 days    ALT  Date Value Ref Range Status  08/17/2017 13 0 - 32 IU/L Final         Failed - Triglycerides in normal range and within 360 days    Triglycerides  Date Value Ref Range Status  11/04/2018 247 (H) 0 - 149 mg/dL Final         Failed - Total Cholesterol in  normal range and within 360 days    Cholesterol, Total  Date Value Ref Range Status  11/04/2018 207 (H) 100 - 199 mg/dL Final         Passed - AST in normal range and within 360 days    AST  Date Value Ref Range Status  11/04/2018 20 0 - 40 IU/L Final         Passed - WBC in normal range and within 360 days    WBC  Date Value Ref Range Status  11/04/2018 8.2 3.4 - 10.8 x10E3/uL Final  11/17/2016 7.0 3.8 - 10.8 Thousand/uL Final         Passed - Completed PHQ-2 or PHQ-9 in the last 360 days.      Passed -  Valid encounter within last 6 months    Recent Outpatient Visits          2 months ago [place] Jerrol Banana., MD   4 months ago Chest pain, unspecified type   [facility] Jerrol Banana., MD   1 year ago Need for influenza vaccination   [facility] Jerrol Banana., MD   1 year ago Encounter for annual physical exam   [facility], Dionne Bucy, MD   1 year ago [place] Trin[place], [place]      Future Appointments            In 1 week Jerrol Banana., MD [facility] [place]

## 2019-03-13 NOTE — Telephone Encounter (Signed)
Medication: Apt Scheduled 03/20/19 - levothyroxine (SYNTHROID) 137 MCG tablet [id number] , omeprazole (PRILOSEC) 40 MG capsule [id number], bisoprolol-hydrochlorothiazide (ZIAC) 5-6.25 MG tablet [id number] , DULoxetine (CYMBALTA) 60 MG capsule [id number] , mirtazapine (REMERON) 30 MG tablet [id number] ,  Has the patient contacted their pharmacy? Yes  (Agent: If no, request that the patient contact the pharmacy for the refill.) (Agent: If yes, when and what did the pharmacy advise?)  Preferred Pharmacy (with phone number or street name): [facility] [place], [place] - [address]  Phone:  [phone]     Agent: Please be advised that RX refills may take up to 3 business days. We ask that you follow-up with your pharmacy.

## 2019-03-20 ENCOUNTER — Ambulatory Visit: Payer: Medicare Other | Admitting: Family Medicine

## 2019-03-23 NOTE — Progress Notes (Signed)
Patient: Jasmine Rich Female    DOB: 1945/08/21   74 y.o.   MRN: [id number] Visit Date: 03/27/2019  Today's Provider: [name], MD   Chief Complaint  Patient presents with   Follow-up   Diabetes   Anxiety   Hypertension   Subjective:     HPI  Pt still having postmenopausal hot flashes despite HRT with estradiol. She says she has h/o celiac disease. Prediabetes  From 11/03/2018-Labs stable except patient now early DM with fasting glucose 131--referred to CCM for teaching and follow up in 3 months.  Anxiety From 01/05/2019-on duloxetine  Estrogen deficiency From 01/05/2019-Increased dose and advised to follow up in 2 months--risks discussed with pt. Would stop if this dose not helpful.  Essential (primary) hypertension From 01/05/2019-no changes made at this visit.  Acquired hypothyroidism From 11/03/2018-Labs stable.   Hypercholesteremia  From 11/03/2018-Labs stable.    Allergies  Allergen Reactions   Codeine Other (See Comments)    Abdominal pain and nausea     Current Outpatient Medications:    acetaminophen (TYLENOL) 500 MG tablet, Take 500 mg by mouth every 6 (six) hours as needed., Disp: , Rfl:    albuterol (VENTOLIN HFA) 108 (90 Base) MCG/ACT inhaler, Inhale 2 puffs into the lungs every 6 (six) hours as needed for wheezing or shortness of breath., Disp: 18 g, Rfl: 2   bisoprolol-hydrochlorothiazide (ZIAC) 5-6.25 MG tablet, Take 1 tablet by mouth daily., Disp: 90 tablet, Rfl: 3   DULoxetine (CYMBALTA) 60 MG capsule, Take 1 capsule (60 mg total) by mouth daily., Disp: 90 capsule, Rfl: 3   levothyroxine (SYNTHROID) 137 MCG tablet, Take 1 tablet (137 mcg total) by mouth daily before breakfast., Disp: 90 tablet, Rfl: 1   mirtazapine (REMERON) 30 MG tablet, Take 1 tablet (30 mg total) by mouth at bedtime., Disp: 90 tablet, Rfl: 3   omeprazole (PRILOSEC) 40 MG capsule, Take 1 capsule (40 mg total) by mouth daily., Disp: 90 capsule,  Rfl: 3   TURMERIC PO, Take 500 mg by mouth daily. Unsure dose, Disp: , Rfl:    valACYclovir (VALTREX) 1000 MG tablet, SMARTSIG:1 Tablet(s) By Mouth Every 12 Hours, Disp: , Rfl:    VITAMIN D PO, Take by mouth., Disp: , Rfl:    estradiol (ESTRACE) 0.5 MG tablet, Take 2 tablets (1 mg total) by mouth daily. (Patient not taking: Reported on 03/27/2019), Disp: 90 tablet, Rfl: 2   ferrous sulfate 325 (65 FE) MG tablet, Take by mouth as needed. Reported on 03/06/2015, Disp: , Rfl:    LORazepam (ATIVAN) 0.5 MG tablet, Take 1 tablet (0.5 mg total) by mouth at bedtime. (Patient not taking: Reported on 01/05/2019), Disp: 90 tablet, Rfl: 1   Multiple Vitamin (MULTIVITAMIN) tablet, Take 1 tablet by mouth daily. occasionally, Disp: , Rfl:   Review of Systems  Constitutional: Negative.  Negative for appetite change, chills, fatigue and fever.  HENT: Negative.   Respiratory: Negative for chest tightness and shortness of breath.   Cardiovascular: Negative for chest pain and palpitations.  Gastrointestinal: Negative for abdominal pain, nausea and vomiting.  Endocrine: Negative.   Musculoskeletal: Negative.   Allergic/Immunologic: Negative.   Neurological: Negative for dizziness and weakness.  Hematological: Negative.   Psychiatric/Behavioral: Negative.     Social History   Tobacco Use   Smoking status: Former Smoker    Quit date: 03/03/1983    Years since quitting: 36.0   Smokeless tobacco: Never Used   Tobacco comment: quit smoking in  1985  Substance Use Topics   Alcohol use: Yes    Alcohol/week: 3.0 standard drinks    Types: 3 Glasses of wine per week      Objective:   BP 121/83 (BP Location: Right Arm, Patient Position: Sitting, Cuff Size: Large)    Pulse 91    Temp (!) 96.9 F (36.1 C) (Other (Comment))    Resp 18    Ht 5\' 3"  (1.6 m)    Wt 204 lb (92.5 kg)    SpO2 96%    BMI 36.14 kg/m  Vitals:   03/27/19 1504  BP: 121/83  Pulse: 91  Resp: 18  Temp: (!) 96.9 F (36.1 C)    TempSrc: Other (Comment)  SpO2: 96%  Weight: 204 lb (92.5 kg)  Height: 5\' 3"  (1.6 m)  Body mass index is 36.14 kg/m.   Physical Exam Vitals reviewed.  Constitutional:      Appearance: She is well-developed.  HENT:     Head: Normocephalic and atraumatic.     Right Ear: External ear normal.     Left Ear: External ear normal.     Nose: Nose normal.  Eyes:     General: No scleral icterus.    Conjunctiva/sclera: Conjunctivae normal.  Neck:     Thyroid: No thyromegaly.  Cardiovascular:     Rate and Rhythm: Normal rate and regular rhythm.     Heart sounds: Normal heart sounds.  Pulmonary:     Effort: Pulmonary effort is normal.     Breath sounds: Normal breath sounds.  Chest:     Breasts:        Right: Normal.        Left: Normal.  Abdominal:     Palpations: Abdomen is soft.  Skin:    General: Skin is warm and dry.  Neurological:     General: No focal deficit present.     Mental Status: She is alert and oriented to person, place, and time.  Psychiatric:        Mood and Affect: Mood normal.        Behavior: Behavior normal.        Thought Content: Thought content normal.        Judgment: Judgment normal.      No results found for any visits on 03/27/19.     Assessment & Plan    1. Prediabetes RTC 4 months., - Hemoglobin A1C  2. Acquired hypothyroidism  - TSH  3. Essential (primary) hypertension Controlled.  4. Gastroesophageal reflux disease with esophagitis without hemorrhage On PPI  5. Major depressive disorder with single episode, in partial remission ([place]) Doing well.  6. 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  7. Class 1 obesity due to excess calories with serious comorbidity and body mass index (BMI) of 34.0 to 34.9 in adult With GERD/HTN  8. Arthritis      [name], MD  [facility]

## 2019-03-27 ENCOUNTER — Other Ambulatory Visit: Payer: Self-pay

## 2019-03-27 ENCOUNTER — Encounter: Payer: Self-pay | Admitting: Family Medicine

## 2019-03-27 ENCOUNTER — Ambulatory Visit (INDEPENDENT_AMBULATORY_CARE_PROVIDER_SITE_OTHER): Payer: Medicare Other | Admitting: Family Medicine

## 2019-03-27 VITALS — BP 121/83 | HR 91 | Temp 96.9°F | Resp 18 | Ht 63.0 in | Wt 204.0 lb

## 2019-03-27 DIAGNOSIS — E66811 Obesity, class 1: Secondary | ICD-10-CM

## 2019-03-27 DIAGNOSIS — M1990 Unspecified osteoarthritis, unspecified site: Secondary | ICD-10-CM

## 2019-03-27 DIAGNOSIS — K2100 Gastro-esophageal reflux disease with esophagitis, without bleeding: Secondary | ICD-10-CM

## 2019-03-27 DIAGNOSIS — N951 Menopausal and female climacteric states: Secondary | ICD-10-CM | POA: Diagnosis not present

## 2019-03-27 DIAGNOSIS — R7303 Prediabetes: Secondary | ICD-10-CM

## 2019-03-27 DIAGNOSIS — Z6834 Body mass index (BMI) 34.0-34.9, adult: Secondary | ICD-10-CM | POA: Diagnosis not present

## 2019-03-27 DIAGNOSIS — E6609 Other obesity due to excess calories: Secondary | ICD-10-CM

## 2019-03-27 DIAGNOSIS — E039 Hypothyroidism, unspecified: Secondary | ICD-10-CM | POA: Diagnosis not present

## 2019-03-27 DIAGNOSIS — F324 Major depressive disorder, single episode, in partial remission: Secondary | ICD-10-CM | POA: Diagnosis not present

## 2019-03-27 DIAGNOSIS — I10 Essential (primary) hypertension: Secondary | ICD-10-CM | POA: Diagnosis not present

## 2019-03-27 MED ORDER — ALBUTEROL SULFATE HFA 108 (90 BASE) MCG/ACT IN AERS: 2.0000 | INHALATION_SPRAY | Freq: Four times a day (QID) | RESPIRATORY_TRACT | 2 refills | Status: DC | PRN

## 2019-03-27 NOTE — Patient Instructions (Addendum)
Stop estradiol. Try OTC Black Cohosh. Follow up in 4 months.

## 2019-03-28 LAB — HEMOGLOBIN A1C
Est. average glucose Bld gHb Est-mCnc: 131 mg/dL
Hgb A1c MFr Bld: 6.2 % — ABNORMAL HIGH (ref 4.8–5.6)

## 2019-03-28 LAB — TSH: TSH: 4.01 u[IU]/mL (ref 0.450–4.500)

## 2019-03-30 ENCOUNTER — Telehealth: Payer: Self-pay

## 2019-03-30 NOTE — Telephone Encounter (Signed)
-----   Message from Jerrol Banana., MD sent at 03/29/2019  4:59 PM EST ----- Blood sugar little better than last time.  Continue to work on diet and exercise to keep from developing diabetes

## 2019-03-30 NOTE — Telephone Encounter (Signed)
Patient advised on lab results. 

## 2019-05-15 DIAGNOSIS — M9903 Segmental and somatic dysfunction of lumbar region: Secondary | ICD-10-CM | POA: Diagnosis not present

## 2019-05-15 DIAGNOSIS — M5033 Other cervical disc degeneration, cervicothoracic region: Secondary | ICD-10-CM | POA: Diagnosis not present

## 2019-05-15 DIAGNOSIS — R519 Headache, unspecified: Secondary | ICD-10-CM | POA: Diagnosis not present

## 2019-05-15 DIAGNOSIS — M9901 Segmental and somatic dysfunction of cervical region: Secondary | ICD-10-CM | POA: Diagnosis not present

## 2019-06-21 DIAGNOSIS — H0264 Xanthelasma of left upper eyelid: Secondary | ICD-10-CM | POA: Diagnosis not present

## 2019-06-21 DIAGNOSIS — D2272 Melanocytic nevi of left lower limb, including hip: Secondary | ICD-10-CM | POA: Diagnosis not present

## 2019-06-21 DIAGNOSIS — L821 Other seborrheic keratosis: Secondary | ICD-10-CM | POA: Diagnosis not present

## 2019-06-21 DIAGNOSIS — H0261 Xanthelasma of right upper eyelid: Secondary | ICD-10-CM | POA: Diagnosis not present

## 2019-06-21 DIAGNOSIS — D2262 Melanocytic nevi of left upper limb, including shoulder: Secondary | ICD-10-CM | POA: Diagnosis not present

## 2019-06-21 DIAGNOSIS — D2261 Melanocytic nevi of right upper limb, including shoulder: Secondary | ICD-10-CM | POA: Diagnosis not present

## 2019-06-21 DIAGNOSIS — D2271 Melanocytic nevi of right lower limb, including hip: Secondary | ICD-10-CM | POA: Diagnosis not present

## 2019-06-21 DIAGNOSIS — D225 Melanocytic nevi of trunk: Secondary | ICD-10-CM | POA: Diagnosis not present

## 2019-07-03 ENCOUNTER — Telehealth: Payer: Self-pay | Admitting: Family Medicine

## 2019-07-03 NOTE — Telephone Encounter (Signed)
Attempted to schedule AWV. Unable to LVM.  Will try at later time. No voicemail. 

## 2019-07-06 ENCOUNTER — Ambulatory Visit: Payer: Self-pay | Admitting: Family Medicine

## 2019-07-20 NOTE — Progress Notes (Signed)
Trena Platt Cummings,acting as a scribe for [name], MD.,have documented all relevant documentation on the behalf of [name], MD,as directed by  [name], MD while in the presence of [name], MD.  Established patient visit   Patient: Jasmine Rich   DOB: 01-26-1946   74 y.o. Female  MRN: [id number] Visit Date: 07/25/2019  Today's healthcare provider: [name], MD   Chief Complaint  Patient presents with  . Hypertension  . Prediabetes  . Depression   Subjective    HPI She c/o bilateral hip pain with no shoulder pain.  She has had both Covid vaccines.  She is excited as she and her husband are getting ready to travel after the Covid vaccines, she feels stable emotionally at this time. Hypertension, follow-up  BP Readings from Last 3 Encounters:  03/27/19 121/83  01/05/19 102/60  11/03/18 111/75   Wt Readings from Last 3 Encounters:  03/27/19 204 lb (92.5 kg)  01/05/19 202 lb (91.6 kg)  11/03/18 202 lb (91.6 kg)     She was last seen for hypertension 4 months ago.  BP at that visit was 121/83. Management since that visit includes; controlled. She reports excellent compliance with treatment. She is not having side effects.  She is not exercising. She is adherent to low salt diet.   Outside blood pressures are not checking.  She does not smoke.  Use of agents associated with hypertension: NSAIDS.   ---------------------------------------------------------------------------------------------------  Prediabetes From 03/27/2019-Hemoglobin A1C 6.2 .  Gastroesophageal reflux disease with esophagitis without hemorrhage From 03/27/2019-On PPI.  Major depressive disorder with single episode, in partial remission ([place]) Doing well.  Female climacteric state From 03/27/2019-Stop estrogen, try OTC black cohash.  Class 1 obesity due to excess calories with serious comorbidity and body mass index (BMI) of 34.0 to 34.9 in  adult From 03/27/2019-With GERD/HTN.   Social History   Tobacco Use  . Smoking status: Former Smoker    Quit date: 03/03/1983    Years since quitting: 36.4  . Smokeless tobacco: Never Used  . Tobacco comment: quit smoking in 1985  Substance Use Topics  . Alcohol use: Yes    Alcohol/week: 3.0 standard drinks    Types: 3 Glasses of wine per week  . Drug use: No       Medications: Outpatient Medications Prior to Visit  Medication Sig  . acetaminophen (TYLENOL) 500 MG tablet Take 500 mg by mouth every 6 (six) hours as needed.  Marland Kitchen albuterol (VENTOLIN HFA) 108 (90 Base) MCG/ACT inhaler Inhale 2 puffs into the lungs every 6 (six) hours as needed for wheezing or shortness of breath.  . bisoprolol-hydrochlorothiazide (ZIAC) 5-6.25 MG tablet Take 1 tablet by mouth daily.  . DULoxetine (CYMBALTA) 60 MG capsule Take 1 capsule (60 mg total) by mouth daily.  Marland Kitchen estradiol (ESTRACE) 0.5 MG tablet Take 2 tablets (1 mg total) by mouth daily. (Patient not taking: Reported on 03/27/2019)  . ferrous sulfate 325 (65 FE) MG tablet Take by mouth as needed. Reported on 03/06/2015  . levothyroxine (SYNTHROID) 137 MCG tablet Take 1 tablet (137 mcg total) by mouth daily before breakfast.  . LORazepam (ATIVAN) 0.5 MG tablet Take 1 tablet (0.5 mg total) by mouth at bedtime. (Patient not taking: Reported on 01/05/2019)  . mirtazapine (REMERON) 30 MG tablet Take 1 tablet (30 mg total) by mouth at bedtime.  . Multiple Vitamin (MULTIVITAMIN) tablet Take 1 tablet by mouth daily. occasionally  . omeprazole (  PRILOSEC) 40 MG capsule Take 1 capsule (40 mg total) by mouth daily.  . TURMERIC PO Take 500 mg by mouth daily. Unsure dose  . valACYclovir (VALTREX) 1000 MG tablet SMARTSIG:1 Tablet(s) By Mouth Every 12 Hours  . VITAMIN D PO Take by mouth.   No facility-administered medications prior to visit.    Review of Systems  Constitutional: Negative for appetite change, chills, fatigue and fever.  Eyes: Negative.     Respiratory: Negative for chest tightness and shortness of breath.   Cardiovascular: Negative for chest pain and palpitations.  Gastrointestinal: Negative for abdominal pain, nausea and vomiting.  Endocrine: Negative.   Musculoskeletal: Positive for arthralgias and back pain.  Allergic/Immunologic: Negative.   Neurological: Negative for dizziness and weakness.  Psychiatric/Behavioral: The patient is nervous/anxious.        Stable recently.       Objective    There were no vitals taken for this visit. BP Readings from Last 3 Encounters:  07/25/19 108/77  03/27/19 121/83  01/05/19 102/60   Wt Readings from Last 3 Encounters:  07/25/19 204 lb (92.5 kg)  03/27/19 204 lb (92.5 kg)  01/05/19 202 lb (91.6 kg)      Physical Exam Vitals reviewed.  Constitutional:      Appearance: Normal appearance.  HENT:     Head: Normocephalic and atraumatic.     Right Ear: External ear normal.     Left Ear: External ear normal.  Eyes:     General: No scleral icterus.    Conjunctiva/sclera: Conjunctivae normal.  Cardiovascular:     Rate and Rhythm: Normal rate and regular rhythm.     Pulses: Normal pulses.     Heart sounds: Normal heart sounds.  Pulmonary:     Effort: Pulmonary effort is normal.     Breath sounds: Normal breath sounds.  Musculoskeletal:        General: Tenderness present.     Right lower leg: No edema.     Left lower leg: No edema.     Comments: Mild tenderness over both greater trochanters.   Skin:    General: Skin is warm and dry.  Neurological:     General: No focal deficit present.     Mental Status: She is alert and oriented to person, place, and time.  Psychiatric:        Mood and Affect: Mood normal.        Behavior: Behavior normal.        Thought Content: Thought content normal.        Judgment: Judgment normal.       No results found for any visits on 07/25/19.  Assessment & Plan     1. Essential (primary) hypertension stable  2. Major  depressive disorder with single episode, in partial remission ([place]) Stop duloxetine and start effexor  I think she did better with this in the past.  3. Prediabetes Work on sugar and carbohydrate intake.  - POCT HgB A1C  4. Acquired hypothyroidism  - levothyroxine (SYNTHROID) 137 MCG tablet; Take 1 tablet (137 mcg total) by mouth daily before breakfast.  Dispense: 90 tablet; Refill: 1  5. Mild intermittent asthma without complication Clinically stable.  No wheezing or shortness of breath. - albuterol (VENTOLIN HFA) 108 (90 Base) MCG/ACT inhaler; Inhale 2 puffs into the lungs every 6 (six) hours as needed for wheezing or shortness of breath.  Dispense: 18 g; Refill: 2 6.Bursitis Try Mobic 7.5 mg daily prn for a couple of weeks.  No follow-ups on file.         Niklas Chretien Cranford Mon, MD  [facility] [phone] (phone) [phone] (fax)  [place]

## 2019-07-25 ENCOUNTER — Other Ambulatory Visit: Payer: Self-pay

## 2019-07-25 ENCOUNTER — Ambulatory Visit (INDEPENDENT_AMBULATORY_CARE_PROVIDER_SITE_OTHER): Payer: Medicare Other | Admitting: Family Medicine

## 2019-07-25 ENCOUNTER — Encounter: Payer: Self-pay | Admitting: Family Medicine

## 2019-07-25 VITALS — BP 108/77 | HR 78 | Temp 97.3°F | Ht 63.0 in | Wt 204.0 lb

## 2019-07-25 DIAGNOSIS — E039 Hypothyroidism, unspecified: Secondary | ICD-10-CM

## 2019-07-25 DIAGNOSIS — I10 Essential (primary) hypertension: Secondary | ICD-10-CM

## 2019-07-25 DIAGNOSIS — R7303 Prediabetes: Secondary | ICD-10-CM

## 2019-07-25 DIAGNOSIS — M7062 Trochanteric bursitis, left hip: Secondary | ICD-10-CM

## 2019-07-25 DIAGNOSIS — M7061 Trochanteric bursitis, right hip: Secondary | ICD-10-CM | POA: Diagnosis not present

## 2019-07-25 DIAGNOSIS — J4520 Mild intermittent asthma, uncomplicated: Secondary | ICD-10-CM | POA: Diagnosis not present

## 2019-07-25 DIAGNOSIS — F324 Major depressive disorder, single episode, in partial remission: Secondary | ICD-10-CM | POA: Diagnosis not present

## 2019-07-25 LAB — POCT GLYCOSYLATED HEMOGLOBIN (HGB A1C)
Estimated Average Glucose: 131
Hemoglobin A1C: 6.2 % — AB (ref 4.0–5.6)

## 2019-07-25 MED ORDER — LEVOTHYROXINE SODIUM 137 MCG PO TABS: 137.0000 ug | ORAL_TABLET | Freq: Every day | ORAL | 1 refills | Status: DC

## 2019-07-25 MED ORDER — ALBUTEROL SULFATE HFA 108 (90 BASE) MCG/ACT IN AERS: 2.0000 | INHALATION_SPRAY | Freq: Four times a day (QID) | RESPIRATORY_TRACT | 2 refills | Status: DC | PRN

## 2019-07-25 MED ORDER — MELOXICAM 7.5 MG PO TABS: 7.5000 mg | ORAL_TABLET | Freq: Every day | ORAL | 1 refills | Status: DC

## 2019-07-25 MED ORDER — VENLAFAXINE HCL ER 75 MG PO CP24: 75.0000 mg | ORAL_CAPSULE | Freq: Every day | ORAL | 3 refills | Status: DC

## 2019-08-07 ENCOUNTER — Other Ambulatory Visit: Payer: Self-pay

## 2019-08-07 DIAGNOSIS — M7062 Trochanteric bursitis, left hip: Secondary | ICD-10-CM

## 2019-08-07 NOTE — Telephone Encounter (Signed)
Copied from [place] [id number]. Topic: General - Other >> Aug 07, 2019  3:25 PM Mcneil, Ja-Kwan wrote: Reason for CRM: Pt stated she spoke with her pharmacy and was advised that the Rx for meloxicam (MOBIC) 7.5 MG tablet was not received. Pt stated she received her other medication but she was advised that they had not received a Rx for the meloxicam (MOBIC) 7.5 MG tablet

## 2019-08-07 NOTE — Addendum Note (Signed)
Addended by: Julieta Bellini on: 08/07/2019 05:22 PM   Modules accepted: Orders

## 2019-08-08 ENCOUNTER — Telehealth: Payer: Self-pay

## 2019-08-08 MED ORDER — MELOXICAM 7.5 MG PO TABS: 7.5000 mg | ORAL_TABLET | Freq: Every day | ORAL | 1 refills | Status: DC

## 2019-08-08 NOTE — Telephone Encounter (Signed)
Spoke with pt to try and schedule her AWV and she was curently on the way to the beach and did not have her calender with her at the time. Pt requested a CB at the beginning of next week to schedule. Note made.

## 2019-08-14 DIAGNOSIS — M9903 Segmental and somatic dysfunction of lumbar region: Secondary | ICD-10-CM | POA: Diagnosis not present

## 2019-08-14 DIAGNOSIS — R519 Headache, unspecified: Secondary | ICD-10-CM | POA: Diagnosis not present

## 2019-08-14 DIAGNOSIS — M9901 Segmental and somatic dysfunction of cervical region: Secondary | ICD-10-CM | POA: Diagnosis not present

## 2019-08-14 DIAGNOSIS — M5033 Other cervical disc degeneration, cervicothoracic region: Secondary | ICD-10-CM | POA: Diagnosis not present

## 2019-08-29 ENCOUNTER — Other Ambulatory Visit: Payer: Self-pay | Admitting: Family Medicine

## 2019-08-29 DIAGNOSIS — Z1231 Encounter for screening mammogram for malignant neoplasm of breast: Secondary | ICD-10-CM

## 2019-09-07 NOTE — Telephone Encounter (Signed)
Spoke with pt who states she is not able to schedule an apt at this time due to her [facility]. Pt requested a CB the week 09/18/19. Note made.

## 2019-09-22 NOTE — Progress Notes (Signed)
Established patient visit  I,[name],acting as a scribe for [name], MD.,have documented all relevant documentation on the behalf of [name], MD,as directed by  [name], MD while in the presence of [name], MD.   Patient: Jasmine Rich   DOB: 1946/01/20   74 y.o. Female  MRN: [id number] Visit Date: 09/27/2019  Today's healthcare provider: [name], MD   Chief Complaint  Patient presents with  . Depression  . Follow-up  . Hypertension   Subjective    HPI   Hypertension, follow-up  BP Readings from Last 3 Encounters:  09/27/19 117/81  07/25/19 108/77  03/27/19 121/83   Wt Readings from Last 3 Encounters:  09/27/19 (!) 206 lb (93.4 kg)  07/25/19 204 lb (92.5 kg)  03/27/19 204 lb (92.5 kg)     She was last seen for hypertension 2 months ago.  BP at that visit was 108/77. Management since that visit includes; stable. She reports good compliance with treatment. She is not having side effects. none She is not exercising. She is not adherent to low salt diet.   Outside blood pressures are none.  She does not smoke.  Use of agents associated with hypertension: none.   --------------------------------------------------------------------  Major depressive disorder with single episode, in partial remission ([place]) From 07/25/2019-Stop duloxetine and start Effexor. I think she did better with this in the past.  Bursitis From 07/25/2019-Try Mobic 7.5 mg daily prn for a couple of weeks.  Patient also wants to discuss right knee pain. Patient states she heard a pop in her right knee around one week ago. Patient has had pain in right knee since. Patient is wearing a brace on knee which is only give mild relief. Also used bengay.      Medications: Outpatient Medications Prior to Visit  Medication Sig  . albuterol (VENTOLIN HFA) 108 (90 Base) MCG/ACT inhaler Inhale 2 puffs into the lungs every 6 (six) hours as  needed for wheezing or shortness of breath.  . bisoprolol-hydrochlorothiazide (ZIAC) 5-6.25 MG tablet Take 1 tablet by mouth daily.  . meloxicam (MOBIC) 7.5 MG tablet Take 1 tablet (7.5 mg total) by mouth daily.  . mirtazapine (REMERON) 30 MG tablet Take 1 tablet (30 mg total) by mouth at bedtime.  Marland Kitchen omeprazole (PRILOSEC) 40 MG capsule Take 1 capsule (40 mg total) by mouth daily.  . TURMERIC PO Take 500 mg by mouth daily. Unsure dose  . venlafaxine XR (EFFEXOR-XR) 75 MG 24 hr capsule Take 1 capsule (75 mg total) by mouth daily with breakfast.  . VITAMIN D PO Take by mouth.  . [DISCONTINUED] levothyroxine (SYNTHROID) 137 MCG tablet Take 1 tablet (137 mcg total) by mouth daily before breakfast.  . [DISCONTINUED] valACYclovir (VALTREX) 1000 MG tablet SMARTSIG:1 Tablet(s) By Mouth Every 12 Hours  . acetaminophen (TYLENOL) 500 MG tablet Take 500 mg by mouth every 6 (six) hours as needed. (Patient not taking: Reported on 09/27/2019)  . DULoxetine (CYMBALTA) 60 MG capsule Take 1 capsule (60 mg total) by mouth daily. (Patient not taking: Reported on 09/27/2019)  . estradiol (ESTRACE) 0.5 MG tablet Take 2 tablets (1 mg total) by mouth daily. (Patient not taking: Reported on 03/27/2019)  . ferrous sulfate 325 (65 FE) MG tablet Take by mouth as needed. Reported on 03/06/2015 (Patient not taking: Reported on 09/27/2019)  . LORazepam (ATIVAN) 0.5 MG tablet Take 1 tablet (0.5 mg total) by mouth at bedtime. (Patient not taking: Reported on  01/05/2019)  . Multiple Vitamin (MULTIVITAMIN) tablet Take 1 tablet by mouth daily. occasionally (Patient not taking: Reported on 09/27/2019)   No facility-administered medications prior to visit.    Review of Systems  Constitutional: Negative for appetite change, chills, fatigue and fever.  Respiratory: Negative for chest tightness and shortness of breath.   Cardiovascular: Negative for chest pain and palpitations.  Gastrointestinal: Negative for abdominal pain, nausea and  vomiting.  Neurological: Negative for dizziness and weakness.      Objective    BP 117/81 (BP Location: Left Arm, Patient Position: Sitting, Cuff Size: Large)   Pulse 75   Temp (!) 96.9 F (36.1 C) (Other (Comment))   Resp 18   Ht 5\' 3"  (1.6 m)   Wt (!) 206 lb (93.4 kg)   SpO2 96%   BMI 36.49 kg/m    Physical Exam Vitals reviewed.  Constitutional:      Appearance: She is well-developed.  HENT:     Head: Normocephalic and atraumatic.     Right Ear: External ear normal.     Left Ear: External ear normal.     Nose: Nose normal.  Eyes:     General: No scleral icterus.    Conjunctiva/sclera: Conjunctivae normal.  Neck:     Thyroid: No thyromegaly.  Cardiovascular:     Rate and Rhythm: Normal rate and regular rhythm.     Heart sounds: Normal heart sounds.  Pulmonary:     Effort: Pulmonary effort is normal.     Breath sounds: Normal breath sounds.  Chest:     Breasts:        Right: Normal.        Left: Normal.  Abdominal:     Palpations: Abdomen is soft.  Skin:    General: Skin is warm and dry.  Neurological:     General: No focal deficit present.     Mental Status: She is alert and oriented to person, place, and time.  Psychiatric:        Mood and Affect: Mood normal.        Behavior: Behavior normal.        Thought Content: Thought content normal.        Judgment: Judgment normal.     BP 117/81 (BP Location: Left Arm, Patient Position: Sitting, Cuff Size: Large)   Pulse 75   Temp (!) 96.9 F (36.1 C) (Other (Comment))   Resp 18   Ht 5\' 3"  (1.6 m)   Wt (!) 206 lb (93.4 kg)   SpO2 96%   BMI 36.49 kg/m   General Appearance:    Alert, cooperative, no distress, appears stated age  Head:    Normocephalic, without obvious abnormality, atraumatic  Eyes:    PERRL, conjunctiva/corneas clear, EOM's intact, fundi    benign, both eyes  Ears:    Normal TM's and external ear canals, both ears  Nose:   Nares normal, septum midline, mucosa normal, no drainage    or  sinus tenderness  Throat:   Lips, mucosa, and tongue normal; teeth and gums normal  Neck:   Supple, symmetrical, trachea midline, no adenopathy;    thyroid:  no enlargement/tenderness/nodules; no carotid   bruit or JVD  Back:     Symmetric, no curvature, ROM normal, no CVA tenderness  Lungs:     Clear to auscultation bilaterally, respirations unlabored  Chest Wall:    No tenderness or deformity   Heart:    Regular rate and rhythm, S1 and S2  normal, no murmur, rub   or gallop  Breast Exam:    No tenderness, masses, or nipple abnormality  Abdomen:     Soft, non-tender, bowel sounds active all four quadrants,    no masses, no organomegaly  Genitalia:    Normal female without lesion, discharge or tenderness  Rectal:    Normal tone, normal prostate, no masses or tenderness;   guaiac negative stool  Extremities:   Extremities normal, atraumatic, no cyanosis or edema  Pulses:   2+ and symmetric all extremities  Skin:   Skin color, texture, turgor normal, no rashes or lesions  Lymph nodes:   Cervical, supraclavicular, and axillary nodes normal  Neurologic:   CNII-XII intact, normal strength, sensation and reflexes    throughout     No results found for any visits on 09/27/19.  Assessment & Plan     1. Essential (primary) hypertension Good control.  She is on Ziac. - CBC w/Diff/Platelet - TSH - Comprehensive Metabolic Panel (CMET) - Lipid panel  2. Major depressive disorder with single episode, in partial remission ([place]) In partial remission on Effexor.  He is improving. - CBC w/Diff/Platelet - TSH - Comprehensive Metabolic Panel (CMET) - Lipid panel  3. Prediabetes Diet and exercise discussed. - CBC w/Diff/Platelet - TSH - Comprehensive Metabolic Panel (CMET) - Lipid panel  4. Acquired hypothyroidism  - levothyroxine (SYNTHROID) 137 MCG tablet; Take 1 tablet (137 mcg total) by mouth daily before breakfast.  Dispense: 30 tablet; Refill: 0 - CBC w/Diff/Platelet - TSH -  Comprehensive Metabolic Panel (CMET) - Lipid panel  5. Trochanteric bursitis of both hips  - naproxen (NAPROSYN) 500 MG tablet; 2 times daily with meals as needed  Dispense: 60 tablet; Refill: 3  6. Class 1 obesity due to excess calories with serious comorbidity and body mass index (BMI) of 34.0 to 34.9 in adult  - CBC w/Diff/Platelet - TSH - Comprehensive Metabolic Panel (CMET) - Lipid panel  7. Right knee pain, unspecified chronicity  - Ambulatory referral to Orthopedic Surgery   Return in about 3 months (around 12/28/2019).         Haile Toppins Cranford Mon, MD  [facility] [phone] (phone) [phone] (fax)  [place]

## 2019-09-26 DIAGNOSIS — M9901 Segmental and somatic dysfunction of cervical region: Secondary | ICD-10-CM | POA: Diagnosis not present

## 2019-09-26 DIAGNOSIS — R519 Headache, unspecified: Secondary | ICD-10-CM | POA: Diagnosis not present

## 2019-09-26 DIAGNOSIS — M5033 Other cervical disc degeneration, cervicothoracic region: Secondary | ICD-10-CM | POA: Diagnosis not present

## 2019-09-26 DIAGNOSIS — M9903 Segmental and somatic dysfunction of lumbar region: Secondary | ICD-10-CM | POA: Diagnosis not present

## 2019-09-27 ENCOUNTER — Encounter: Payer: Self-pay | Admitting: Family Medicine

## 2019-09-27 ENCOUNTER — Other Ambulatory Visit: Payer: Self-pay

## 2019-09-27 ENCOUNTER — Ambulatory Visit (INDEPENDENT_AMBULATORY_CARE_PROVIDER_SITE_OTHER): Payer: Medicare Other | Admitting: Family Medicine

## 2019-09-27 VITALS — BP 117/81 | HR 75 | Temp 96.9°F | Resp 18 | Ht 63.0 in | Wt 206.0 lb

## 2019-09-27 DIAGNOSIS — F324 Major depressive disorder, single episode, in partial remission: Secondary | ICD-10-CM

## 2019-09-27 DIAGNOSIS — I10 Essential (primary) hypertension: Secondary | ICD-10-CM

## 2019-09-27 DIAGNOSIS — E039 Hypothyroidism, unspecified: Secondary | ICD-10-CM

## 2019-09-27 DIAGNOSIS — M25561 Pain in right knee: Secondary | ICD-10-CM | POA: Diagnosis not present

## 2019-09-27 DIAGNOSIS — R7303 Prediabetes: Secondary | ICD-10-CM

## 2019-09-27 DIAGNOSIS — Z6834 Body mass index (BMI) 34.0-34.9, adult: Secondary | ICD-10-CM

## 2019-09-27 DIAGNOSIS — M7062 Trochanteric bursitis, left hip: Secondary | ICD-10-CM | POA: Diagnosis not present

## 2019-09-27 DIAGNOSIS — E6609 Other obesity due to excess calories: Secondary | ICD-10-CM

## 2019-09-27 DIAGNOSIS — M7061 Trochanteric bursitis, right hip: Secondary | ICD-10-CM

## 2019-09-27 MED ORDER — VALACYCLOVIR HCL 1 G PO TABS: ORAL_TABLET | ORAL | 1 refills | Status: AC

## 2019-09-27 MED ORDER — LEVOTHYROXINE SODIUM 137 MCG PO TABS: 137.0000 ug | ORAL_TABLET | Freq: Every day | ORAL | 0 refills | Status: DC

## 2019-09-27 MED ORDER — NAPROXEN 500 MG PO TABS
ORAL_TABLET | ORAL | 3 refills | Status: DC
Start: 2019-09-27 — End: 2019-12-27

## 2019-09-27 NOTE — Patient Instructions (Signed)
Stop meloxicam.

## 2019-09-28 ENCOUNTER — Ambulatory Visit
Admission: RE | Admit: 2019-09-28 | Discharge: 2019-09-28 | Disposition: A | Discharge status: Home / Self Care | Payer: Medicare Other | Source: Ambulatory Visit | Attending: Family Medicine | Admitting: Family Medicine

## 2019-09-28 DIAGNOSIS — Z1231 Encounter for screening mammogram for malignant neoplasm of breast: Secondary | ICD-10-CM | POA: Diagnosis not present

## 2019-09-28 LAB — LIPID PANEL
Chol/HDL Ratio: 8.2 ratio — ABNORMAL HIGH (ref 0.0–4.4)
Cholesterol, Total: 246 mg/dL — ABNORMAL HIGH (ref 100–199)
HDL: 30 mg/dL — ABNORMAL LOW (ref >39)
LDL Chol Calc (NIH): 158 mg/dL — ABNORMAL HIGH (ref 0–99)
Triglycerides: 310 mg/dL — ABNORMAL HIGH (ref 0–149)
VLDL Cholesterol Cal: 58 mg/dL — ABNORMAL HIGH (ref 5–40)

## 2019-09-28 LAB — COMPREHENSIVE METABOLIC PANEL
ALT: 11 IU/L (ref 0–32)
AST: 21 IU/L (ref 0–40)
Albumin/Globulin Ratio: 2.2 (ref 1.2–2.2)
Albumin: 4.6 g/dL (ref 3.7–4.7)
Alkaline Phosphatase: 89 IU/L (ref 48–121)
BUN/Creatinine Ratio: 13 (ref 12–28)
BUN: 15 mg/dL (ref 8–27)
Bilirubin Total: 0.3 mg/dL (ref 0.0–1.2)
CO2: 24 mmol/L (ref 20–29)
Calcium: 9.7 mg/dL (ref 8.7–10.3)
Chloride: 98 mmol/L (ref 96–106)
Creatinine, Ser: 1.12 mg/dL — ABNORMAL HIGH (ref 0.57–1.00)
GFR calc Af Amer: 56 mL/min/{1.73_m2} — ABNORMAL LOW (ref >59)
GFR calc non Af Amer: 49 mL/min/{1.73_m2} — ABNORMAL LOW (ref >59)
Globulin, Total: 2.1 g/dL (ref 1.5–4.5)
Glucose: 97 mg/dL (ref 65–99)
Potassium: 4.6 mmol/L (ref 3.5–5.2)
Sodium: 139 mmol/L (ref 134–144)
Total Protein: 6.7 g/dL (ref 6.0–8.5)

## 2019-09-28 LAB — CBC WITH DIFFERENTIAL/PLATELET
Basophils Absolute: 0 10*3/uL (ref 0.0–0.2)
Basos: 1 %
EOS (ABSOLUTE): 0.1 10*3/uL (ref 0.0–0.4)
Eos: 2 %
Hematocrit: 41.3 % (ref 34.0–46.6)
Hemoglobin: 13.6 g/dL (ref 11.1–15.9)
Immature Grans (Abs): 0.1 10*3/uL (ref 0.0–0.1)
Immature Granulocytes: 1 %
Lymphocytes Absolute: 2.8 10*3/uL (ref 0.7–3.1)
Lymphs: 32 %
MCH: 29.9 pg (ref 26.6–33.0)
MCHC: 32.9 g/dL (ref 31.5–35.7)
MCV: 91 fL (ref 79–97)
Monocytes Absolute: 1.1 10*3/uL — ABNORMAL HIGH (ref 0.1–0.9)
Monocytes: 12 %
Neutrophils Absolute: 4.7 10*3/uL (ref 1.4–7.0)
Neutrophils: 52 %
Platelets: 349 10*3/uL (ref 150–450)
RBC: 4.55 x10E6/uL (ref 3.77–5.28)
RDW: 14.2 % (ref 11.7–15.4)
WBC: 8.9 10*3/uL (ref 3.4–10.8)

## 2019-09-28 LAB — TSH: TSH: 9.12 u[IU]/mL — ABNORMAL HIGH (ref 0.450–4.500)

## 2019-09-29 ENCOUNTER — Telehealth: Payer: Self-pay

## 2019-09-29 DIAGNOSIS — E7800 Pure hypercholesterolemia, unspecified: Secondary | ICD-10-CM

## 2019-09-29 MED ORDER — LEVOTHYROXINE SODIUM 150 MCG PO TABS
150.0000 ug | ORAL_TABLET | Freq: Every day | ORAL | 3 refills | Status: DC
Start: 2019-09-29 — End: 2020-09-05

## 2019-09-29 NOTE — Telephone Encounter (Signed)
Patient advised. She prefers to start a cholesterol lowering medication due to limit ability to exercise.

## 2019-09-29 NOTE — Telephone Encounter (Signed)
Copied from [place] [id number]. Topic: General - Other >> Sep 29, 2019 10:41 AM Leward Quan A wrote: Reason for CRM: Patient called to inform Dr Rosanna Randy that she spoke to Dr Sanjuan Dame office who is waiting on word from Dr Rosanna Randy in order to schedule her to be seen. Please call patient with an update Ph# [phone]

## 2019-09-29 NOTE — Telephone Encounter (Signed)
-----   Message from Jerrol Banana., MD sent at 09/29/2019 10:03 AM EDT ----- Labs stable but lipids moderately elevated.  Work hard on diet and exercise.  Increase levothyroxine from 137 to 150 mcg daily.

## 2019-10-02 DIAGNOSIS — M1711 Unilateral primary osteoarthritis, right knee: Secondary | ICD-10-CM | POA: Diagnosis not present

## 2019-10-04 ENCOUNTER — Other Ambulatory Visit: Payer: Self-pay

## 2019-10-04 NOTE — Telephone Encounter (Signed)
Need to verify which pharmacy she wants this sent to.  Tried calling and no answer

## 2019-10-04 NOTE — Telephone Encounter (Signed)
Crestor 10 mg daily

## 2019-10-05 MED ORDER — ROSUVASTATIN CALCIUM 10 MG PO TABS: 10.0000 mg | ORAL_TABLET | Freq: Every day | ORAL | 3 refills | Status: DC

## 2019-10-18 NOTE — Telephone Encounter (Signed)
Scheduled telephonic AWV 11/01/19 @ 2:40 PM.

## 2019-10-30 ENCOUNTER — Telehealth: Payer: Self-pay | Admitting: *Deleted

## 2019-10-30 NOTE — Telephone Encounter (Signed)
Copied from [place] [id number]. Topic: General - Other >> Oct 30, 2019  9:55 AM Rainey Pines A wrote: Patient stated that she was recently seen and does not wish to book an appointment but is requesting that Dr. Rosanna Randy order her a urinalyisis for a potential uti patient eels she has. Advised patient of next available opening and patient insisted a message be sent to her PCP. Please advise

## 2019-10-30 NOTE — Telephone Encounter (Signed)
Please advise 

## 2019-10-31 NOTE — Progress Notes (Signed)
Subjective:   Jasmine Rich is a 74 y.o. female who presents for Medicare Annual (Subsequent) preventive examination.   I connected with Emya Picado today by telephone and verified that I am speaking with the correct person using two identifiers. Location patient: home Location provider: work Persons participating in the virtual visit: patient, provider.   I discussed the limitations, risks, security and privacy concerns of performing an evaluation and management service by telephone and the availability of in person appointments. I also discussed with the patient that there may be a patient responsible charge related to this service. The patient expressed understanding and verbally consented to this telephonic visit.    Interactive audio and video telecommunications were attempted between this provider and patient, however failed, due to patient having technical difficulties OR patient did not have access to video capability.  We continued and completed visit with audio only.   Review of Systems    N/A  Cardiac Risk Factors include: advanced age (>93men, >9 women);dyslipidemia;obesity (BMI >30kg/m2);hypertension     Objective:    Today's Vitals   11/01/19 1437  PainSc: 6    There is no height or weight on file to calculate BMI.  Advanced Directives 11/01/2019 07/19/2018 07/15/2017 07/14/2016 07/15/2015  Does Patient Have a Medical Advance Directive? No No No No No  Would patient like information on creating a medical advance directive? No - Patient declined Yes (MAU/Ambulatory/Procedural Areas - Information given) Yes (MAU/Ambulatory/Procedural Areas - Information given) Yes (MAU/Ambulatory/Procedural Areas - Information given) -    Current Medications (verified) Outpatient Encounter Medications as of 11/01/2019  Medication Sig  . acetaminophen (TYLENOL) 500 MG tablet Take 500 mg by mouth every 6 (six) hours as needed.   Marland Kitchen albuterol (VENTOLIN HFA) 108 (90 Base) MCG/ACT inhaler  Inhale 2 puffs into the lungs every 6 (six) hours as needed for wheezing or shortness of breath.  . bisoprolol-hydrochlorothiazide (ZIAC) 5-6.25 MG tablet Take 1 tablet by mouth daily.  . Cyanocobalamin 2500 MCG CHEW Chew 3,000 mcg by mouth daily.  Marland Kitchen levothyroxine (SYNTHROID) 150 MCG tablet Take 1 tablet (150 mcg total) by mouth daily.  Marland Kitchen LORazepam (ATIVAN) 0.5 MG tablet Take 1 tablet (0.5 mg total) by mouth at bedtime.  . meloxicam (MOBIC) 7.5 MG tablet Take 1 tablet (7.5 mg total) by mouth daily.  Marland Kitchen omeprazole (PRILOSEC) 40 MG capsule Take 1 capsule (40 mg total) by mouth daily.  . rosuvastatin (CRESTOR) 10 MG tablet Take 1 tablet (10 mg total) by mouth daily.  Marland Kitchen venlafaxine XR (EFFEXOR-XR) 75 MG 24 hr capsule Take 1 capsule (75 mg total) by mouth daily with breakfast.  . DULoxetine (CYMBALTA) 60 MG capsule Take 1 capsule (60 mg total) by mouth daily. (Patient not taking: Reported on 09/27/2019)  . estradiol (ESTRACE) 0.5 MG tablet Take 2 tablets (1 mg total) by mouth daily. (Patient not taking: Reported on 03/27/2019)  . ferrous sulfate 325 (65 FE) MG tablet Take by mouth as needed. Reported on 03/06/2015 (Patient not taking: Reported on 09/27/2019)  . mirtazapine (REMERON) 30 MG tablet Take 1 tablet (30 mg total) by mouth at bedtime. (Patient not taking: Reported on 11/01/2019)  . Multiple Vitamin (MULTIVITAMIN) tablet Take 1 tablet by mouth daily. occasionally (Patient not taking: Reported on 11/01/2019)  . naproxen (NAPROSYN) 500 MG tablet 2 times daily with meals as needed (Patient not taking: Reported on 11/01/2019)  . TURMERIC PO Take 500 mg by mouth daily. Unsure dose (Patient not taking: Reported on 11/01/2019)  .  valACYclovir (VALTREX) 1000 MG tablet SMARTSIG:1 Tablet(s) By Mouth Every 12 Hours (Patient not taking: Reported on 11/01/2019)  . VITAMIN D PO Take by mouth. (Patient not taking: Reported on 11/01/2019)   No facility-administered encounter medications on file as of 11/01/2019.    Allergies  (verified) Codeine   History: Past Medical History:  Diagnosis Date  . Anemia   . Anxiety   . Depression   . GERD (gastroesophageal reflux disease)   . Hypertension   . Thyroid disease   . Tremor, unspecified    throat tremor- diagnosed at [facility]   Past Surgical History:  Procedure Laterality Date  . ABDOMINAL HYSTERECTOMY  1983  . ABDOMINAL HYSTERECTOMY    . CATARACT EXTRACTION, BILATERAL  08/2015  . cataract removed    . FOOT SURGERY    . HAND SURGERY    . INNER EAR SURGERY    . LASIK  11/2015  . MIDDLE EAR SURGERY    . THYROIDECTOMY  10/1988  . THYROIDECTOMY     Family History  Problem Relation Age of Onset  . Cancer Mother        Lung and brain cancer. Died from Non- Hodgkin's Lymphoma  . Lung cancer Mother   . Brain cancer Mother   . Hypothyroidism Son   . Stroke Father   . Hypertension Father   . Alcohol abuse Sister   . Diabetes Sister   . COPD Brother   . Prostate cancer Paternal Grandfather   . Alcohol abuse Brother   . Diabetes Brother   . Thyroid disease Son   . Arthritis Son   . Hashimoto's thyroiditis Son   . Breast cancer Maternal Grandmother   . Ovarian cancer Maternal Grandmother   . Colon cancer Neg Hx    Social History   Socioeconomic History  . Marital status: Married    Spouse name: Michae Kava  . Number of children: 2  . Years of education: Not on file  . Highest education level: Some college, no degree  Occupational History  . Not on file  Tobacco Use  . Smoking status: Former Smoker    Quit date: 03/03/1983    Years since quitting: 36.6  . Smokeless tobacco: Never Used  . Tobacco comment: quit smoking in 1985  Vaping Use  . Vaping Use: Never used  Substance and Sexual Activity  . Alcohol use: Yes    Alcohol/week: 3.0 standard drinks    Types: 3 Glasses of wine per week    Comment: 1 / night  . Drug use: No  . Sexual activity: Not Currently    Birth control/protection: Surgical  Other Topics Concern  . Not on file    Social History Narrative          Social Determinants of Health   Financial Resource Strain: Low Risk   . Difficulty of Paying Living Expenses: Not hard at all  Food Insecurity: No Food Insecurity  . Worried About Charity fundraiser in the Last Year: Never true  . Ran Out of Food in the Last Year: Never true  Transportation Needs: No Transportation Needs  . Lack of Transportation (Medical): No  . Lack of Transportation (Non-Medical): No  Physical Activity: Inactive  . Days of Exercise per Week: 0 days  . Minutes of Exercise per Session: 0 min  Stress: Stress Concern Present  . Feeling of Stress : Rather much  Social Connections: Moderately Isolated  . Frequency of Communication with Friends and Family: More  than three times a week  . Frequency of Social Gatherings with Friends and Family: More than three times a week  . Attends Religious Services: Never  . Active Member of Clubs or Organizations: No  . Attends Archivist Meetings: Never  . Marital Status: Married    Tobacco Counseling Counseling given: Not Answered Comment: quit smoking in 1985   Clinical Intake:  Pre-visit preparation completed: Yes  Pain : 0-10 Pain Score: 6  Pain Type: Acute pain Pain Location: Bladder (Possibly UTI) Pain Descriptors / Indicators: Burning, Discomfort Pain Onset: In the past 7 days Pain Frequency: Constant Pain Relieving Factors: Pt is taking OTC AZO for urinary s/s.  Pain Relieving Factors: Pt is taking OTC AZO for urinary s/s.  Nutritional Risks: None Diabetes: No (Borderline)  How often do you need to have someone help you when you read instructions, pamphlets, or other written materials from your doctor or pharmacy?: 1 - Never  Diabetic? No  Interpreter Needed?: No  Information entered by :: [facility], LPN   Activities of Daily Living In your present state of health, do you have any difficulty performing the following activities: 11/01/2019 11/03/2018   Hearing? 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 N N  Difficulty concentrating or making decisions? N N  Walking or climbing stairs? Y N  Comment Due to knee pains. -  Dressing or bathing? N N  Doing errands, shopping? N N  Preparing Food and eating ? N -  Using the Toilet? N -  In the past six months, have you accidently leaked urine? N -  Do you have problems with loss of bowel control? N -  Managing your Medications? N -  Managing your Finances? N -  Housekeeping or managing your Housekeeping? N -  Some recent data might be hidden    Patient Care Team: Jerrol Banana., MD as PCP - General (Family Medicine) Brita Romp, Dionne Bucy, MD as Consulting Physician (Family Medicine) Jannet Mantis, MD (Dermatology) [facility])  Indicate any recent Medical Services you may have received from other than [facility] providers in the past year (date may be approximate).     Assessment:   This is a routine wellness examination for [facility].  Hearing/Vision screen No exam data present  Dietary issues and exercise activities discussed: Current Exercise Habits: Home exercise routine;The patient does not participate in regular exercise at present, Exercise limited by: None identified  Goals    . DIET - INCREASE WATER INTAKE     Recommend to cut back on caffeinated drinks and increase water intake to 4-6 glasses a day.       Depression Screen PHQ 2/9 Scores 11/01/2019 07/25/2019 01/05/2019 11/03/2018 07/19/2018 07/15/2017 07/14/2016  PHQ - 2 Score 0 3 4 0 0 1 0  PHQ- 9 Score - 8 6 0 - - -    Fall Risk Fall Risk  11/01/2019 11/03/2018 07/19/2018 10/01/2017 08/13/2017  Falls in the past year? 0 0 0 No No  Comment - - - Emmi Telephone Survey: data to providers prior to load -  Number falls in past yr: 0 0 - - -  Injury with Fall? 0 0 - - -    Any stairs in or around the home? Yes  If so, are there any without handrails? No  Home free of loose throw rugs in  walkways, pet beds, electrical cords, etc? Yes  Adequate lighting in your home to reduce  risk of falls? Yes   ASSISTIVE DEVICES UTILIZED TO PREVENT FALLS:  Life alert? No  Use of a cane, walker or w/c? No  Grab bars in the bathroom? No  Shower chair or bench in shower? No  Elevated toilet seat or a handicapped toilet? Yes    Cognitive Function: Unable to complete today.        Immunizations Immunization History  Administered Date(s) Administered  . Influenza, High Dose Seasonal PF 12/14/2016, 11/17/2017  . Influenza-Unspecified 11/30/2012, 12/17/2014  . PFIZER SARS-COV-2 Vaccination 04/14/2019, 05/09/2019  . Pneumococcal Conjugate-13 08/13/2017  . Pneumococcal Polysaccharide-23 10/27/2010  . Tdap 10/27/2010    TDAP status: Up to date Flu Vaccine status: Due fall 2021 Pneumococcal vaccine status: Up to date Covid-19 vaccine status: Completed vaccines  Qualifies for Shingles Vaccine? Yes   Zostavax completed No   Shingrix Completed?: No.    Education has been provided regarding the importance of this vaccine. Patient has been advised to call insurance company to determine out of pocket expense if they have not yet received this vaccine. Advised may also receive vaccine at local pharmacy or Health Dept. Verbalized acceptance and understanding.  Screening Tests Health Maintenance  Topic Date Due  . INFLUENZA VACCINE  10/01/2019  . DEXA SCAN  09/26/2020  . TETANUS/TDAP  10/26/2020  . COLONOSCOPY  08/03/2021  . MAMMOGRAM  09/27/2021  . COVID-19 Vaccine  Completed  . Hepatitis C Screening  Completed  . PNA vac Low Risk Adult  Completed    Health Maintenance  Health Maintenance Due  Topic Date Due  . INFLUENZA VACCINE  10/01/2019    Colorectal cancer screening: Completed 08/04/11. Repeat every 10 years Mammogram status: Completed 09/28/19. Repeat every year Bone Density status: Completed 09/27/18. Results reflect: Bone density results: OSTEOPOROSIS. Repeat every 2  years.  Lung Cancer Screening: (Low Dose CT Chest recommended if Age 66-80 years, 30 pack-year currently smoking OR have quit w/in 15years.) does not qualify.    Additional Screening:  Hepatitis C Screening: Up to date  Vision Screening: Recommended annual ophthalmology exams for early detection of glaucoma and other disorders of the eye. Is the patient up to date with their annual eye exam?  Yes  Who is the provider or what is the name of the office in which the patient attends annual eye exams? Dr Gwynn Burly If pt is not established with a provider, would they like to be referred to a provider to establish care? No .   Dental Screening: Recommended annual dental exams for proper oral hygiene  Community Resource Referral / Chronic Care Management: CRR required this visit?  No   CCM required this visit?  No      Plan:     I have personally reviewed and noted the following in the patient's chart:   . Medical and social history . Use of alcohol, tobacco or illicit drugs  . Current medications and supplements . Functional ability and status . Nutritional status . Physical activity . Advanced directives . List of other physicians . Hospitalizations, surgeries, and ER visits in previous 12 months . Vitals . Screenings to include cognitive, depression, and falls . Referrals and appointments  In addition, I have reviewed and discussed with patient certain preventive protocols, quality metrics, and best practice recommendations. A written personalized care plan for preventive services as well as general preventive health recommendations were provided to patient.     Korvin Valentine [place], [place]   03/05/863   Nurse Notes: Pt is due to a  flu shot at next in office apt.

## 2019-11-01 ENCOUNTER — Other Ambulatory Visit: Payer: Self-pay

## 2019-11-01 ENCOUNTER — Ambulatory Visit (INDEPENDENT_AMBULATORY_CARE_PROVIDER_SITE_OTHER): Payer: Medicare Other

## 2019-11-01 DIAGNOSIS — Z0000 Encounter for general adult medical examination without abnormal findings: Secondary | ICD-10-CM

## 2019-11-01 NOTE — Patient Instructions (Signed)
[name] , Thank you for taking time to come for your Medicare Wellness Visit. I appreciate your ongoing commitment to your health goals. Please review the following plan we discussed and let me know if I can assist you in the future.   Screening recommendations/referrals: Colonoscopy: Up to date, due 08/03/2021 Mammogram: Up to date, due 08/2020 Bone Density: Up to date, due 08/2020 Recommended yearly ophthalmology/optometry visit for glaucoma screening and checkup Recommended yearly dental visit for hygiene and checkup  Vaccinations: Influenza vaccine: Due fall 2021 Pneumococcal vaccine: Completed series Tdap vaccine: Up to date, due 09/2020 Shingles vaccine: Shingrix discussed. Please contact your pharmacy for coverage information.     Advanced directives: Advance directive discussed with you today. Even though you declined this today please call our office should you change your mind and we can give you the proper paperwork for you to fill out.  Conditions/risks identified: Recommend to increase water intake to 6-8 8 oz glasses a day.  Next appointment: 12/28/19 @ 2:40 PM with Dr Rosanna Randy    Preventive Care 55 Years and Older, Female Preventive care refers to lifestyle choices and visits with your health care provider that can promote health and wellness. What does preventive care include?  A yearly physical exam. This is also called an annual well check.  Dental exams once or twice a year.  Routine eye exams. Ask your health care provider how often you should have your eyes checked.  Personal lifestyle choices, including:  Daily care of your teeth and gums.  Regular physical activity.  Eating a healthy diet.  Avoiding tobacco and drug use.  Limiting alcohol use.  Practicing safe sex.  Taking low-dose aspirin every day.  Taking vitamin and mineral supplements as recommended by your health care provider. What happens during an annual well check? The services and  screenings done by your health care provider during your annual well check will depend on your age, overall health, lifestyle risk factors, and family history of disease. Counseling  Your health care provider may ask you questions about your:  Alcohol use.  Tobacco use.  Drug use.  Emotional well-being.  Home and relationship well-being.  Sexual activity.  Eating habits.  History of falls.  Memory and ability to understand (cognition).  Work and work Statistician.  Reproductive health. Screening  You may have the following tests or measurements:  Height, weight, and BMI.  Blood pressure.  Lipid and cholesterol levels. These may be checked every 5 years, or more frequently if you are over 48 years old.  Skin check.  Lung cancer screening. You may have this screening every year starting at age 73 if you have a 30-pack-year history of smoking and currently smoke or have quit within the past 15 years.  Fecal occult blood test (FOBT) of the stool. You may have this test every year starting at age 84.  Flexible sigmoidoscopy or colonoscopy. You may have a sigmoidoscopy every 5 years or a colonoscopy every 10 years starting at age 106.  Hepatitis C blood test.  Hepatitis B blood test.  Sexually transmitted disease (STD) testing.  Diabetes screening. This is done by checking your blood sugar (glucose) after you have not eaten for a while (fasting). You may have this done every 1-3 years.  Bone density scan. This is done to screen for osteoporosis. You may have this done starting at age 15.  Mammogram. This may be done every 1-2 years. Talk to your health care provider about how often you  should have regular mammograms. Talk with your health care provider about your test results, treatment options, and if necessary, the need for more tests. Vaccines  Your health care provider may recommend certain vaccines, such as:  Influenza vaccine. This is recommended every  year.  Tetanus, diphtheria, and acellular pertussis (Tdap, Td) vaccine. You may need a Td booster every 10 years.  Zoster vaccine. You may need this after age 2.  Pneumococcal 13-valent conjugate (PCV13) vaccine. One dose is recommended after age 63.  Pneumococcal polysaccharide (PPSV23) vaccine. One dose is recommended after age 80. Talk to your health care provider about which screenings and vaccines you need and how often you need them. This information is not intended to replace advice given to you by your health care provider. Make sure you discuss any questions you have with your health care provider. Document Released: 03/15/2015 Document Revised: 11/06/2015 Document Reviewed: 12/18/2014 Elsevier Interactive Patient Education  2017 [employer] Prevention in the Home Falls can cause injuries. They can happen to people of all ages. There are many things you can do to make your home safe and to help prevent falls. What can I do on the outside of my home?  Regularly fix the edges of walkways and driveways and fix any cracks.  Remove anything that might make you trip as you walk through a door, such as a raised step or threshold.  Trim any bushes or trees on the path to your home.  Use bright outdoor lighting.  Clear any walking paths of anything that might make someone trip, such as rocks or tools.  Regularly check to see if handrails are loose or broken. Make sure that both sides of any steps have handrails.  Any raised decks and porches should have guardrails on the edges.  Have any leaves, snow, or ice cleared regularly.  Use sand or salt on walking paths during winter.  Clean up any spills in your garage right away. This includes oil or grease spills. What can I do in the bathroom?  Use night lights.  Install grab bars by the toilet and in the tub and shower. Do not use towel bars as grab bars.  Use non-skid mats or decals in the tub or shower.  If you  need to sit down in the shower, use a plastic, non-slip stool.  Keep the floor dry. Clean up any water that spills on the floor as soon as it happens.  Remove soap buildup in the tub or shower regularly.  Attach bath mats securely with double-sided non-slip rug tape.  Do not have throw rugs and other things on the floor that can make you trip. What can I do in the bedroom?  Use night lights.  Make sure that you have a light by your bed that is easy to reach.  Do not use any sheets or blankets that are too big for your bed. They should not hang down onto the floor.  Have a firm chair that has side arms. You can use this for support while you get dressed.  Do not have throw rugs and other things on the floor that can make you trip. What can I do in the kitchen?  Clean up any spills right away.  Avoid walking on wet floors.  Keep items that you use a lot in easy-to-reach places.  If you need to reach something above you, use a strong step stool that has a grab bar.  Keep electrical cords out  of the way.  Do not use floor polish or wax that makes floors slippery. If you must use wax, use non-skid floor wax.  Do not have throw rugs and other things on the floor that can make you trip. What can I do with my stairs?  Do not leave any items on the stairs.  Make sure that there are handrails on both sides of the stairs and use them. Fix handrails that are broken or loose. Make sure that handrails are as long as the stairways.  Check any carpeting to make sure that it is firmly attached to the stairs. Fix any carpet that is loose or worn.  Avoid having throw rugs at the top or bottom of the stairs. If you do have throw rugs, attach them to the floor with carpet tape.  Make sure that you have a light switch at the top of the stairs and the bottom of the stairs. If you do not have them, ask someone to add them for you. What else can I do to help prevent falls?  Wear shoes  that:  Do not have high heels.  Have rubber bottoms.  Are comfortable and fit you well.  Are closed at the toe. Do not wear sandals.  If you use a stepladder:  Make sure that it is fully opened. Do not climb a closed stepladder.  Make sure that both sides of the stepladder are locked into place.  Ask someone to hold it for you, if possible.  Clearly mark and make sure that you can see:  Any grab bars or handrails.  First and last steps.  Where the edge of each step is.  Use tools that help you move around (mobility aids) if they are needed. These include:  Canes.  Walkers.  Scooters.  Crutches.  Turn on the lights when you go into a dark area. Replace any light bulbs as soon as they burn out.  Set up your furniture so you have a clear path. Avoid moving your furniture around.  If any of your floors are uneven, fix them.  If there are any pets around you, be aware of where they are.  Review your medicines with your doctor. Some medicines can make you feel dizzy. This can increase your chance of falling. Ask your doctor what other things that you can do to help prevent falls. This information is not intended to replace advice given to you by your health care provider. Make sure you discuss any questions you have with your health care provider. Document Released: 12/13/2008 Document Revised: 07/25/2015 Document Reviewed: 03/23/2014 Elsevier Interactive Patient Education  2017 [employer].

## 2019-11-01 NOTE — Telephone Encounter (Signed)
Spoke with pt today to complete her telephonic AWV and pt was inquiring about what PCP said regarding her request for a urinalysis due to a potential UTI. Advised her that she can also request an E-visit through [facility] if she did not want to come in for an apt. Pt stated if she did not hear back from PCP today she would try an E-visit tomorrow. FYI!

## 2019-12-13 DIAGNOSIS — Z23 Encounter for immunization: Secondary | ICD-10-CM | POA: Diagnosis not present

## 2019-12-27 ENCOUNTER — Encounter: Payer: Self-pay | Admitting: Family Medicine

## 2019-12-27 ENCOUNTER — Ambulatory Visit (INDEPENDENT_AMBULATORY_CARE_PROVIDER_SITE_OTHER): Payer: Medicare Other | Admitting: Family Medicine

## 2019-12-27 ENCOUNTER — Other Ambulatory Visit: Payer: Self-pay

## 2019-12-27 VITALS — BP 108/78 | HR 81 | Temp 97.9°F | Resp 16 | Ht 63.0 in | Wt 208.0 lb

## 2019-12-27 DIAGNOSIS — M7062 Trochanteric bursitis, left hip: Secondary | ICD-10-CM | POA: Diagnosis not present

## 2019-12-27 DIAGNOSIS — M7061 Trochanteric bursitis, right hip: Secondary | ICD-10-CM | POA: Diagnosis not present

## 2019-12-27 DIAGNOSIS — E7800 Pure hypercholesterolemia, unspecified: Secondary | ICD-10-CM | POA: Diagnosis not present

## 2019-12-27 DIAGNOSIS — I10 Essential (primary) hypertension: Secondary | ICD-10-CM

## 2019-12-27 DIAGNOSIS — E039 Hypothyroidism, unspecified: Secondary | ICD-10-CM

## 2019-12-27 DIAGNOSIS — R7303 Prediabetes: Secondary | ICD-10-CM

## 2019-12-27 DIAGNOSIS — F324 Major depressive disorder, single episode, in partial remission: Secondary | ICD-10-CM

## 2019-12-27 LAB — POCT GLYCOSYLATED HEMOGLOBIN (HGB A1C)
Est. average glucose Bld gHb Est-mCnc: 126
Hemoglobin A1C: 6 % — AB (ref 4.0–5.6)

## 2019-12-27 MED ORDER — MELOXICAM 7.5 MG PO TABS: 7.5000 mg | ORAL_TABLET | Freq: Every day | ORAL | 1 refills | Status: AC

## 2019-12-27 MED ORDER — VENLAFAXINE HCL ER 150 MG PO CP24: 150.0000 mg | ORAL_CAPSULE | Freq: Every day | ORAL | 3 refills | Status: DC

## 2019-12-27 NOTE — Progress Notes (Signed)
I,Jasmine Rich,acting as a scribe for Jasmine Durie, MD.,have documented all relevant documentation on the behalf of Jasmine Durie, MD,as directed by  Jasmine Durie, MD while in the presence of Jasmine Durie, MD.   Established patient visit   Patient: Jasmine Rich   DOB: 12/08/1945   74 y.o. Female  MRN: [id number] Visit Date: 12/27/2019  Today's healthcare provider: [name], MD   Chief Complaint  Patient presents with  . Depression  . Follow-up  . Hyperlipidemia  . Hypertension  . Prediabetes   Subjective    HPI  Overall patient is struggling some.  She would like to go up on Effexor to help her anxiety and mild depression.  Been is having major issues with PTSD. She takes occasional low-dose meloxicam and it helps her arthritic pain.  Other problems are listed and stable Hypertension, follow-up  BP Readings from Last 3 Encounters:  12/27/19 108/78  09/27/19 117/81  07/25/19 108/77   Wt Readings from Last 3 Encounters:  12/27/19 208 lb (94.3 kg)  09/27/19 (!) 206 lb (93.4 kg)  07/25/19 204 lb (92.5 kg)     She was last seen for hypertension 3 months ago.  BP at that visit was 117/81. Management since that visit includes; good control. She is on Ziac. She reports good compliance with treatment. She is not having side effects. none She is exercising. She is adherent to low salt diet.   Outside blood pressures are good.  She does not smoke.  Use of agents associated with hypertension: none.   --------------------------------------------------------------------  Lipid/Cholesterol, follow-up  Last Lipid Panel: Lab Results  Component Value Date   CHOL 246 (H) 09/27/2019   LDLCALC 158 (H) 09/27/2019   HDL 30 (L) 09/27/2019   TRIG 310 (H) 09/27/2019    She was last seen for this 3 months ago.  Management since that visit includes; labs checked showing-stable but lipids moderately elevated. Work hard on diet and  exercise. She reports good compliance with treatment. She is not having side effects. none She is following a Regular, Low Sodium diet. Current exercise: walking  Last metabolic panel Lab Results  Component Value Date   GLUCOSE 97 09/27/2019   NA 139 09/27/2019   K 4.6 09/27/2019   BUN 15 09/27/2019   CREATININE 1.12 (H) 09/27/2019   GFRNONAA 49 (L) 09/27/2019   GFRAA 56 (L) 09/27/2019   CALCIUM 9.7 09/27/2019   AST 21 09/27/2019   ALT 11 09/27/2019   The 10-year ASCVD risk score Mikey Bussing DC Jr., et al., 2013) is: 14.5%  --------------------------------------------------------------------  Depression, Follow-up  She  was last seen for this 3 months ago. Changes made at last visit include; In partial remission on Effexor.  She is improving.   She reports good compliance with treatment. She is not having side effects. none  She reports good tolerance of treatment. Current symptoms include: n/a She feels she is Unchanged since last visit.  Depression screen [facility] 2/9 12/27/2019 11/01/2019 07/25/2019  Decreased Interest 0 0 2  Down, Depressed, Hopeless 0 0 1  PHQ - 2 Score 0 0 3  Altered sleeping 1 - 0  Tired, decreased energy 0 - 2  Change in appetite 0 - 2  Feeling bad or failure about yourself  0 - 1  Trouble concentrating 0 - 0  Moving slowly or fidgety/restless 0 - 0  Suicidal thoughts 0 - 0  PHQ-9 Score 1 - 8  Difficult doing work/chores  Not difficult at all - Not difficult at all    --------------------------------------------------------------------  Prediabetes From 09/27/2019-Diet and exercise discussed. Last Hgb A1c was on 07/25/2019-6.2.  Right knee pain, unspecified chronicity From 09/27/2019-referred to Orthopedic Surgery.       Medications: Outpatient Medications Prior to Visit  Medication Sig  . albuterol (VENTOLIN HFA) 108 (90 Base) MCG/ACT inhaler Inhale 2 puffs into the lungs every 6 (six) hours as needed for wheezing or shortness of breath.  .  bisoprolol-hydrochlorothiazide (ZIAC) 5-6.25 MG tablet Take 1 tablet by mouth daily.  . ferrous sulfate 325 (65 FE) MG tablet Take by mouth as needed. Reported on 03/06/2015  . levothyroxine (SYNTHROID) 150 MCG tablet Take 1 tablet (150 mcg total) by mouth daily.  Marland Kitchen LORazepam (ATIVAN) 0.5 MG tablet Take 1 tablet (0.5 mg total) by mouth at bedtime.  . meloxicam (MOBIC) 7.5 MG tablet Take 1 tablet (7.5 mg total) by mouth daily.  . mirtazapine (REMERON) 30 MG tablet Take 1 tablet (30 mg total) by mouth at bedtime.  . Multiple Vitamin (MULTIVITAMIN) tablet Take 1 tablet by mouth daily. occasionally   . omeprazole (PRILOSEC) 40 MG capsule Take 1 capsule (40 mg total) by mouth daily.  . rosuvastatin (CRESTOR) 10 MG tablet Take 1 tablet (10 mg total) by mouth daily.  Marland Kitchen venlafaxine XR (EFFEXOR-XR) 75 MG 24 hr capsule Take 1 capsule (75 mg total) by mouth daily with breakfast.  . acetaminophen (TYLENOL) 500 MG tablet Take 500 mg by mouth every 6 (six) hours as needed.  (Patient not taking: Reported on 12/27/2019)  . Cyanocobalamin 2500 MCG CHEW Chew 3,000 mcg by mouth daily.  . DULoxetine (CYMBALTA) 60 MG capsule Take 1 capsule (60 mg total) by mouth daily. (Patient not taking: Reported on 09/27/2019)  . estradiol (ESTRACE) 0.5 MG tablet Take 2 tablets (1 mg total) by mouth daily. (Patient not taking: Reported on 03/27/2019)  . naproxen (NAPROSYN) 500 MG tablet 2 times daily with meals as needed (Patient not taking: Reported on 11/01/2019)  . TURMERIC PO Take 500 mg by mouth daily. Unsure dose (Patient not taking: Reported on 11/01/2019)  . valACYclovir (VALTREX) 1000 MG tablet SMARTSIG:1 Tablet(s) By Mouth Every 12 Hours (Patient not taking: Reported on 11/01/2019)  . VITAMIN D PO Take by mouth. (Patient not taking: Reported on 11/01/2019)   No facility-administered medications prior to visit.    Review of Systems  Constitutional: Negative for appetite change, chills, fatigue and fever.  Respiratory: Negative  for chest tightness and shortness of breath.   Cardiovascular: Negative for chest pain and palpitations.  Gastrointestinal: Negative for abdominal pain, nausea and vomiting.  Neurological: Negative for dizziness and weakness.       Objective    BP 108/78 (BP Location: Left Arm, Patient Position: Sitting, Cuff Size: Large)   Pulse 81   Temp 97.9 F (36.6 C) (Oral)   Resp 16   Ht 5\' 3"  (1.6 m)   Wt 208 lb (94.3 kg)   SpO2 100%   BMI 36.85 kg/m  BP Readings from Last 3 Encounters:  12/27/19 108/78  09/27/19 117/81  07/25/19 108/77   Wt Readings from Last 3 Encounters:  12/27/19 208 lb (94.3 kg)  09/27/19 (!) 206 lb (93.4 kg)  07/25/19 204 lb (92.5 kg)      Physical Exam Vitals reviewed.  Constitutional:      Appearance: She is well-developed.  HENT:     Head: Normocephalic and atraumatic.     Right Ear: External ear normal.  Left Ear: External ear normal.     Nose: Nose normal.  Eyes:     General: No scleral icterus.    Conjunctiva/sclera: Conjunctivae normal.  Neck:     Thyroid: No thyromegaly.  Cardiovascular:     Rate and Rhythm: Normal rate and regular rhythm.     Heart sounds: Normal heart sounds.  Pulmonary:     Effort: Pulmonary effort is normal.     Breath sounds: Normal breath sounds.  Chest:     Breasts:        Right: Normal.        Left: Normal.  Abdominal:     Palpations: Abdomen is soft.  Skin:    General: Skin is warm and dry.  Neurological:     General: No focal deficit present.     Mental Status: She is alert and oriented to person, place, and time.  Psychiatric:        Mood and Affect: Mood normal.        Behavior: Behavior normal.        Thought Content: Thought content normal.        Judgment: Judgment normal.       No results found for any visits on 12/27/19.  Assessment & Plan     1. Major depressive disorder with single episode, in partial remission (HCC)/GAD Increased Effexor from 75 mg to 150 mg qd. Follow-up in 2  months. - venlafaxine XR (EFFEXOR-XR) 150 MG 24 hr capsule; Take 1 capsule (150 mg total) by mouth daily with breakfast.  Dispense: 90 capsule; Refill: 3 - Lipid panel - TSH - Comprehensive Metabolic Panel (CMET)  2. Essential (primary) hypertension  - Lipid panel - TSH - Comprehensive Metabolic Panel (CMET)  3. Hypercholesteremia  - Lipid panel - TSH - Comprehensive Metabolic Panel (CMET)  4. Prediabetes  - Lipid panel - TSH - Comprehensive Metabolic Panel (CMET) - POCT glycosylated hemoglobin (Hb A1C) 6.0 today.  5. Acquired hypothyroidism  - Lipid panel - TSH - Comprehensive Metabolic Panel (CMET)  6. Trochanteric bursitis of both hips  - meloxicam (MOBIC) 7.5 MG tablet; Take 1 tablet (7.5 mg total) by mouth daily.  Dispense: 90 tablet; Refill: 1 - Lipid panel - TSH - Comprehensive Metabolic Panel (CMET)   No follow-ups on file.        Britini Garcilazo Cranford Mon, MD  [facility] [phone] (phone) [phone] (fax)  [place]

## 2019-12-28 ENCOUNTER — Ambulatory Visit: Payer: Self-pay | Admitting: Family Medicine

## 2019-12-28 DIAGNOSIS — E039 Hypothyroidism, unspecified: Secondary | ICD-10-CM | POA: Diagnosis not present

## 2019-12-28 DIAGNOSIS — F324 Major depressive disorder, single episode, in partial remission: Secondary | ICD-10-CM | POA: Diagnosis not present

## 2019-12-28 DIAGNOSIS — R7303 Prediabetes: Secondary | ICD-10-CM | POA: Diagnosis not present

## 2019-12-28 DIAGNOSIS — E7800 Pure hypercholesterolemia, unspecified: Secondary | ICD-10-CM | POA: Diagnosis not present

## 2019-12-28 DIAGNOSIS — I10 Essential (primary) hypertension: Secondary | ICD-10-CM | POA: Diagnosis not present

## 2019-12-28 DIAGNOSIS — M7062 Trochanteric bursitis, left hip: Secondary | ICD-10-CM | POA: Diagnosis not present

## 2019-12-28 DIAGNOSIS — M7061 Trochanteric bursitis, right hip: Secondary | ICD-10-CM | POA: Diagnosis not present

## 2019-12-29 LAB — LIPID PANEL
Chol/HDL Ratio: 4 ratio (ref 0.0–4.4)
Cholesterol, Total: 147 mg/dL (ref 100–199)
HDL: 37 mg/dL — ABNORMAL LOW (ref >39)
LDL Chol Calc (NIH): 89 mg/dL (ref 0–99)
Triglycerides: 117 mg/dL (ref 0–149)
VLDL Cholesterol Cal: 21 mg/dL (ref 5–40)

## 2019-12-29 LAB — COMPREHENSIVE METABOLIC PANEL
ALT: 14 IU/L (ref 0–32)
AST: 18 IU/L (ref 0–40)
Albumin/Globulin Ratio: 1.9 (ref 1.2–2.2)
Albumin: 4.2 g/dL (ref 3.7–4.7)
Alkaline Phosphatase: 102 IU/L (ref 44–121)
BUN/Creatinine Ratio: 15 (ref 12–28)
BUN: 15 mg/dL (ref 8–27)
Bilirubin Total: 0.3 mg/dL (ref 0.0–1.2)
CO2: 26 mmol/L (ref 20–29)
Calcium: 9.5 mg/dL (ref 8.7–10.3)
Chloride: 101 mmol/L (ref 96–106)
Creatinine, Ser: 1 mg/dL (ref 0.57–1.00)
GFR calc Af Amer: 64 mL/min/{1.73_m2} (ref >59)
GFR calc non Af Amer: 56 mL/min/{1.73_m2} — ABNORMAL LOW (ref >59)
Globulin, Total: 2.2 g/dL (ref 1.5–4.5)
Glucose: 115 mg/dL — ABNORMAL HIGH (ref 65–99)
Potassium: 4.7 mmol/L (ref 3.5–5.2)
Sodium: 141 mmol/L (ref 134–144)
Total Protein: 6.4 g/dL (ref 6.0–8.5)

## 2019-12-29 LAB — TSH: TSH: 1.18 u[IU]/mL (ref 0.450–4.500)

## 2020-01-04 ENCOUNTER — Other Ambulatory Visit: Payer: Self-pay

## 2020-01-04 ENCOUNTER — Ambulatory Visit (INDEPENDENT_AMBULATORY_CARE_PROVIDER_SITE_OTHER): Payer: Medicare Other | Admitting: Adult Health

## 2020-01-04 ENCOUNTER — Encounter: Payer: Self-pay | Admitting: Adult Health

## 2020-01-04 VITALS — BP 131/79 | HR 84 | Temp 97.5°F | Resp 100 | Wt 210.2 lb

## 2020-01-04 DIAGNOSIS — R300 Dysuria: Secondary | ICD-10-CM

## 2020-01-04 DIAGNOSIS — R102 Pelvic and perineal pain unspecified side: Secondary | ICD-10-CM

## 2020-01-04 DIAGNOSIS — R350 Frequency of micturition: Secondary | ICD-10-CM | POA: Diagnosis not present

## 2020-01-04 LAB — POCT URINALYSIS DIPSTICK
Bilirubin, UA: NEGATIVE
Color, UA: NEGATIVE
Glucose, UA: NEGATIVE
Ketones, UA: NEGATIVE
Leukocytes, UA: NEGATIVE
Nitrite, UA: NEGATIVE
Protein, UA: NEGATIVE
Spec Grav, UA: <=1.005 — AB (ref 1.010–1.025)
Urobilinogen, UA: 0.2 E.U./dL
pH, UA: 7 (ref 5.0–8.0)

## 2020-01-04 MED ORDER — CEPHALEXIN 500 MG PO CAPS
500.0000 mg | ORAL_CAPSULE | Freq: Three times a day (TID) | ORAL | 0 refills | Status: DC
Start: 2020-01-04 — End: 2021-03-11

## 2020-01-04 NOTE — Progress Notes (Signed)
Established patient visit   Patient: Jasmine Rich   DOB: Aug 24, 1945   74 y.o. Female  MRN: [id number] Visit Date: 01/04/2020  Today's healthcare provider: Marcille Buffy, FNP   Chief Complaint  Patient presents with  . Urinary Frequency   Subjective    Urinary Frequency  This is a new problem. The current episode started in the past 7 days. The problem occurs every urination. The problem has been unchanged. The pain is moderate. There has been no fever. Associated symptoms include flank pain, frequency and urgency. Pertinent negatives include no chills, discharge, hematuria, hesitancy, nausea, possible pregnancy, sweats or vomiting. Treatments tried: otc Azo. The treatment provided no relief.     She has pain in her lower back and pelvic pain left lower. She has had a hysterectomy -partial, has ovaries. She feels fatigued more than usual. Denies fever but says she has wore her jacket all day and this is unusual for her.   that started yesterday, She has had urgency, and burning for three weeks she reports she forgot to tell Dr. Rosanna Randy at recent visit.   He increased 137 mcg to 150 mcg due to lab.   Denies hematuria.  She has had frequency and mild hesitancy. More frequency and burning than hesitancy.  Regular bowel movements per patient no blood or tarry color.   Patient  denies any fever, chills, rash, chest pain, shortness of breath, nausea, vomiting, or diarrhea.     Patient Active Problem List   Diagnosis Date Noted  . Pelvic pain in female 01/04/2020  . Urinary frequency 01/04/2020  . Dysuria 01/04/2020  . Gastroesophageal reflux disease without esophagitis 07/28/2017  . Perimenopausal vasomotor symptoms 04/04/2015  . Increased BMI 04/04/2015  . Female climacteric state 04/04/2015  . Other symptoms and signs concerning food and fluid intake 04/04/2015  . Anxiety 08/27/2014  . Arthritis 08/27/2014  . Cataract 08/27/2014  . Clinical depression  08/27/2014  . Large hiatal hernia 08/27/2014  . Insomnia 08/27/2014  . Adiposity 08/27/2014  . Pure hypercholesterolemia 07/27/2014  . Acquired hypothyroidism 07/27/2014  . Anemia 07/27/2014  . Anxiety 07/27/2014  . Arthritis 07/27/2014  . Cataract 07/27/2014  . Hearing loss 07/27/2014  . Clinical depression 07/27/2014  . Essential (primary) hypertension 07/27/2014  . Esophagitis, reflux 07/27/2014  . Diaphragmatic hernia 07/27/2014  . Hypercholesteremia 07/27/2014  . Adiposity 07/27/2014  . Major depressive disorder, single episode 07/27/2014  . Dysphonia 11/03/2011   Past Medical History:  Diagnosis Date  . Anemia   . Anxiety   . Depression   . GERD (gastroesophageal reflux disease)   . Hypertension   . Thyroid disease   . Tremor, unspecified    throat tremor- diagnosed at [place]  Allergen Reactions  . Codeine Other (See Comments)    Abdominal pain and nausea       Medications: Outpatient Medications Prior to Visit  Medication Sig  . albuterol (VENTOLIN HFA) 108 (90 Base) MCG/ACT inhaler Inhale 2 puffs into the lungs every 6 (six) hours as needed for wheezing or shortness of breath.  . bisoprolol-hydrochlorothiazide (ZIAC) 5-6.25 MG tablet Take 1 tablet by mouth daily.  . Cyanocobalamin 2500 MCG CHEW Chew 3,000 mcg by mouth daily.  . ferrous sulfate 325 (65 FE) MG tablet Take by mouth as needed. Reported on 03/06/2015  . levothyroxine (SYNTHROID) 150 MCG tablet Take 1 tablet (150 mcg total) by mouth daily.  Marland Kitchen LORazepam (ATIVAN) 0.5 MG tablet Take 1 tablet (  0.5 mg total) by mouth at bedtime.  . meloxicam (MOBIC) 7.5 MG tablet Take 1 tablet (7.5 mg total) by mouth daily.  . mirtazapine (REMERON) 30 MG tablet Take 1 tablet (30 mg total) by mouth at bedtime.  . Multiple Vitamin (MULTIVITAMIN) tablet Take 1 tablet by mouth daily. occasionally   . omeprazole (PRILOSEC) 40 MG capsule Take 1 capsule (40 mg total) by mouth daily.  . rosuvastatin (CRESTOR) 10  MG tablet Take 1 tablet (10 mg total) by mouth daily.  Marland Kitchen venlafaxine XR (EFFEXOR-XR) 150 MG 24 hr capsule Take 1 capsule (150 mg total) by mouth daily with breakfast.  . acetaminophen (TYLENOL) 500 MG tablet Take 500 mg by mouth every 6 (six) hours as needed.  (Patient not taking: Reported on 12/27/2019)  . DULoxetine (CYMBALTA) 60 MG capsule Take 1 capsule (60 mg total) by mouth daily. (Patient not taking: Reported on 09/27/2019)  . estradiol (ESTRACE) 0.5 MG tablet Take 2 tablets (1 mg total) by mouth daily. (Patient not taking: Reported on 03/27/2019)  . TURMERIC PO Take 500 mg by mouth daily. Unsure dose (Patient not taking: Reported on 11/01/2019)  . valACYclovir (VALTREX) 1000 MG tablet SMARTSIG:1 Tablet(s) By Mouth Every 12 Hours (Patient not taking: Reported on 11/01/2019)  . VITAMIN D PO Take by mouth. (Patient not taking: Reported on 11/01/2019)   No facility-administered medications prior to visit.    Review of Systems  Constitutional: Negative for chills.  Gastrointestinal: Negative for nausea and vomiting.  Genitourinary: Positive for flank pain, frequency and urgency. Negative for hematuria and hesitancy.    Last CBC Lab Results  Component Value Date   WBC 8.9 09/27/2019   HGB 13.6 09/27/2019   HCT 41.3 09/27/2019   MCV 91 09/27/2019   MCH 29.9 09/27/2019   RDW 14.2 09/27/2019   PLT 349 83/15/1761   Last metabolic panel Lab Results  Component Value Date   GLUCOSE 115 (H) 12/28/2019   NA 141 12/28/2019   K 4.7 12/28/2019   CL 101 12/28/2019   CO2 26 12/28/2019   BUN 15 12/28/2019   CREATININE 1.00 12/28/2019   GFRNONAA 56 (L) 12/28/2019   GFRAA 64 12/28/2019   CALCIUM 9.5 12/28/2019   PROT 6.4 12/28/2019   ALBUMIN 4.2 12/28/2019   LABGLOB 2.2 12/28/2019   AGRATIO 1.9 12/28/2019   BILITOT 0.3 12/28/2019   ALKPHOS 102 12/28/2019   AST 18 12/28/2019   ALT 14 12/28/2019   Last thyroid functions Lab Results  Component Value Date   TSH 1.180 12/28/2019   Last  vitamin D No results found for: 25OHVITD2, 25OHVITD3, VD25OH Last vitamin B12 and Folate No results found for: VITAMINB12, FOLATE    Objective    BP 131/79   Pulse 84   Temp (!) 97.5 F (36.4 C) (Oral)   Resp (!) 100   Wt 210 lb 3.2 oz (95.3 kg)   BMI 37.24 kg/m  BP Readings from Last 3 Encounters:  01/04/20 131/79  12/27/19 108/78  09/27/19 117/81   Wt Readings from Last 3 Encounters:  01/04/20 210 lb 3.2 oz (95.3 kg)  12/27/19 208 lb (94.3 kg)  09/27/19 (!) 206 lb (93.4 kg)      Physical Exam Constitutional:      General: She is not in acute distress.    Appearance: Normal appearance. She is obese. She is not ill-appearing, toxic-appearing or diaphoretic.  HENT:     Head: Normocephalic and atraumatic.     Right Ear: External ear normal.  Left Ear: External ear normal.     Nose: Nose normal. No congestion.     Mouth/Throat:     Mouth: Mucous membranes are moist.     Pharynx: No oropharyngeal exudate or posterior oropharyngeal erythema.  Eyes:     General: No scleral icterus.       Right eye: No discharge.        Left eye: No discharge.     Pupils: Pupils are equal, round, and reactive to light.  Cardiovascular:     Rate and Rhythm: Normal rate and regular rhythm.     Pulses: Normal pulses.     Heart sounds: Normal heart sounds. No murmur heard.  No friction rub. No gallop.   Pulmonary:     Effort: Pulmonary effort is normal. No respiratory distress.     Breath sounds: Normal breath sounds. No stridor. No wheezing, rhonchi or rales.  Chest:     Chest wall: No tenderness.  Abdominal:     General: Bowel sounds are normal. There is no distension.     Palpations: Abdomen is soft.     Tenderness: There is abdominal tenderness. There is no right CVA tenderness, left CVA tenderness or guarding.    Musculoskeletal:        General: Normal range of motion.     Cervical back: Normal range of motion and neck supple.  Lymphadenopathy:     Cervical: No cervical  adenopathy.  Skin:    General: Skin is warm.     Findings: No erythema or rash.     Comments: No rash or zoster.   Neurological:     Mental Status: She is alert and oriented to person, place, and time. Mental status is at baseline.     Motor: No weakness.     Gait: Gait normal.  Psychiatric:        Mood and Affect: Mood normal.        Thought Content: Thought content normal.        Judgment: Judgment normal.      Results for orders placed or performed in visit on 01/04/20  POCT urinalysis dipstick  Result Value Ref Range   Color, UA negative    Clarity, UA clear    Glucose, UA Negative Negative   Bilirubin, UA negative    Ketones, UA negative    Spec Grav, UA <=1.005 (A) 1.010 - 1.025   Blood, UA moderate    pH, UA 7.0 5.0 - 8.0   Protein, UA Negative Negative   Urobilinogen, UA 0.2 0.2 or 1.0 E.U./dL   Nitrite, UA negative    Leukocytes, UA Negative Negative   Appearance     Odor      Assessment & Plan     Pelvic pain in female  Urinary frequency - Plan: POCT urinalysis dipstick, Urine Culture, DG Abd 1 View, CBC with Differential/Platelet  Dysuria KUB now rule out large stone, discussed symptoms of pyelonephritis and seeking care immediately.  Started on Keflex, urine for culture. CBC now.    Orders Placed This Encounter  Procedures  . Urine Culture  . DG Abd 1 View    Order Specific Question:   Reason for Exam (SYMPTOM  OR DIAGNOSIS REQUIRED)    Answer:   rule out kidney stone.pain left flank    Order Specific Question:   Preferred imaging location?    Answer:   [facility]  . CBC with Differential/Platelet  . POCT urinalysis dipstick     Meds  ordered this encounter  Medications  . cephALEXin (KEFLEX) 500 MG capsule    Sig: Take 1 capsule (500 mg total) by mouth 3 (three) times daily.    Dispense:  21 capsule    Refill:  0   Return in 1 week (on 01/11/2020), or if symptoms worsen or fail to improve, for at any time for any worsening  symptoms, Go to Emergency room/ urgent care if worse.     Strict return precautions discussed.   Red Flags discussed. The patient was given clear instructions to go to ER or return to medical center if any red flags develop, symptoms do not improve, worsen or new problems develop. They verbalized understanding.    Marcille Buffy, [place] [phone] (phone) [phone] (fax)  [place]

## 2020-01-04 NOTE — Progress Notes (Signed)
Suspect possible kidney stone, ordered  stat KUB, placed on Keflex given fatigue. CBC ordered.

## 2020-01-04 NOTE — Patient Instructions (Signed)
Urinary Tract Infection, Adult A urinary tract infection (UTI) is an infection of any part of the urinary tract. The urinary tract includes:  The kidneys.  The ureters.  The bladder.  The urethra. These organs make, store, and get rid of pee (urine) in the body. What are the causes? This is caused by germs (bacteria) in your genital area. These germs grow and cause swelling (inflammation) of your urinary tract. What increases the risk? You are more likely to develop this condition if:  You have a small, thin tube (catheter) to drain pee.  You cannot control when you pee or poop (incontinence).  You are female, and: ? You use these methods to prevent pregnancy:  A medicine that kills sperm (spermicide).  A device that blocks sperm (diaphragm). ? You have low levels of a female hormone (estrogen). ? You are pregnant.  You have genes that add to your risk.  You are sexually active.  You take antibiotic medicines.  You have trouble peeing because of: ? A prostate that is bigger than normal, if you are female. ? A blockage in the part of your body that drains pee from the bladder (urethra). ? A kidney stone. ? A nerve condition that affects your bladder (neurogenic bladder). ? Not getting enough to drink. ? Not peeing often enough.  You have other conditions, such as: ? Diabetes. ? A weak disease-fighting system (immune system). ? Sickle cell disease. ? Gout. ? Injury of the spine. What are the signs or symptoms? Symptoms of this condition include:  Needing to pee right away (urgently).  Peeing often.  Peeing small amounts often.  Pain or burning when peeing.  Blood in the pee.  Pee that smells bad or not like normal.  Trouble peeing.  Pee that is cloudy.  Fluid coming from the vagina, if you are female.  Pain in the belly or lower back. Other symptoms include:  Throwing up (vomiting).  No urge to eat.  Feeling mixed up (confused).  Being tired  and grouchy (irritable).  A fever.  Watery poop (diarrhea). How is this treated? This condition may be treated with:  Antibiotic medicine.  Other medicines.  Drinking enough water. Follow these instructions at home:  Medicines  Take over-the-counter and prescription medicines only as told by your doctor.  If you were prescribed an antibiotic medicine, take it as told by your doctor. Do not stop taking it even if you start to feel better. General instructions  Make sure you: ? Pee until your bladder is empty. ? Do not hold pee for a long time. ? Empty your bladder after sex. ? Wipe from front to back after pooping if you are a female. Use each tissue one time when you wipe.  Drink enough fluid to keep your pee pale yellow.  Keep all follow-up visits as told by your doctor. This is important. Contact a doctor if:  You do not get better after 1-2 days.  Your symptoms go away and then come back. Get help right away if:  You have very bad back pain.  You have very bad pain in your lower belly.  You have a fever.  You are sick to your stomach (nauseous).  You are throwing up. Summary  A urinary tract infection (UTI) is an infection of any part of the urinary tract.  This condition is caused by germs in your genital area.  There are many risk factors for a UTI. These include having a small, thin   tube to drain pee and not being able to control when you pee or poop.  Treatment includes antibiotic medicines for germs.  Drink enough fluid to keep your pee pale yellow. This information is not intended to replace advice given to you by your health care provider. Make sure you discuss any questions you have with your health care provider. Document Revised: 02/03/2018 Document Reviewed: 08/26/2017 Elsevier Patient Education  2020 [employer]  

## 2020-01-05 LAB — CBC WITH DIFFERENTIAL/PLATELET
Basophils Absolute: 0 10*3/uL (ref 0.0–0.2)
Basos: 0 %
EOS (ABSOLUTE): 0.1 10*3/uL (ref 0.0–0.4)
Eos: 1 %
Hematocrit: 41.8 % (ref 34.0–46.6)
Hemoglobin: 14.2 g/dL (ref 11.1–15.9)
Immature Grans (Abs): 0.1 10*3/uL (ref 0.0–0.1)
Immature Granulocytes: 1 %
Lymphocytes Absolute: 2.3 10*3/uL (ref 0.7–3.1)
Lymphs: 18 %
MCH: 31.3 pg (ref 26.6–33.0)
MCHC: 34 g/dL (ref 31.5–35.7)
MCV: 92 fL (ref 79–97)
Monocytes Absolute: 1.3 10*3/uL — ABNORMAL HIGH (ref 0.1–0.9)
Monocytes: 10 %
Neutrophils Absolute: 8.7 10*3/uL — ABNORMAL HIGH (ref 1.4–7.0)
Neutrophils: 70 %
Platelets: 370 10*3/uL (ref 150–450)
RBC: 4.53 x10E6/uL (ref 3.77–5.28)
RDW: 12.7 % (ref 11.7–15.4)
WBC: 12.5 10*3/uL — ABNORMAL HIGH (ref 3.4–10.8)

## 2020-01-05 NOTE — Progress Notes (Signed)
Elevated WBC, neutrophils signs of infection. Awaiting urine culture continue Keflex as prescribed.  Seek care immediately if worsening signs of infection.  Repeat CBC recommended 2 weeks.

## 2020-01-06 LAB — URINE CULTURE

## 2020-01-08 NOTE — Progress Notes (Signed)
50, 000 to 100,000 mixed urogenital flora, no specific bacteria. She was treated given symptoms, please verify with patient if she has has resolution of her symptoms.

## 2020-01-09 ENCOUNTER — Telehealth: Payer: Self-pay

## 2020-01-09 DIAGNOSIS — D72829 Elevated white blood cell count, unspecified: Secondary | ICD-10-CM

## 2020-01-09 NOTE — Telephone Encounter (Signed)
Patient advised and will return next week for labs. KW

## 2020-01-09 NOTE — Telephone Encounter (Signed)
-----   Message from Doreen Beam, [place] sent at 01/08/2020  9:00 AM EST ----- 50, 000 to 100,000 mixed urogenital flora, no specific bacteria. She was treated given symptoms, please verify with patient if she has has resolution of her symptoms.
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
## 2020-01-30 ENCOUNTER — Encounter: Payer: Self-pay | Admitting: Adult Health

## 2020-01-30 ENCOUNTER — Other Ambulatory Visit: Payer: Self-pay

## 2020-01-30 ENCOUNTER — Ambulatory Visit (INDEPENDENT_AMBULATORY_CARE_PROVIDER_SITE_OTHER): Payer: Medicare Other | Admitting: Adult Health

## 2020-01-30 VITALS — BP 104/79 | HR 81 | Temp 98.0°F | Resp 16 | Wt 209.6 lb

## 2020-01-30 DIAGNOSIS — R195 Other fecal abnormalities: Secondary | ICD-10-CM | POA: Diagnosis not present

## 2020-01-30 DIAGNOSIS — Z87898 Personal history of other specified conditions: Secondary | ICD-10-CM | POA: Diagnosis not present

## 2020-01-30 DIAGNOSIS — K279 Peptic ulcer, site unspecified, unspecified as acute or chronic, without hemorrhage or perforation: Secondary | ICD-10-CM | POA: Diagnosis not present

## 2020-01-30 MED ORDER — OMEPRAZOLE 40 MG PO CPDR: 40.0000 mg | DELAYED_RELEASE_CAPSULE | Freq: Every day | ORAL | 3 refills | Status: DC

## 2020-01-30 NOTE — Patient Instructions (Addendum)
Stop meloxicam, and all NSAID's- Motrin, Aleve, and Advil. Tylenol is ok.  Call GI office as do recommend a  follow up since you are established at [facility].   If occurs again go to the hospital if office closed.   Peptic Ulcer  A peptic ulcer is a painful sore in the lining of your stomach or the first part of your small intestine. What are the causes? Common causes of this condition include:  An infection.  Using certain pain medicines too often or too much. What increases the risk? You are more likely to get this condition if you:  Smoke.  Have a family history of ulcer disease.  Drink alcohol.  Have been hospitalized in an intensive care unit (ICU). What are the signs or symptoms? Symptoms include:  Burning pain in the area between the chest and the belly button. The pain may: ? Not go away (be persistent). ? Be worse when your stomach is empty. ? Be worse at night.  Heartburn.  Feeling sick to your stomach (nauseous) and throwing up (vomiting).  Bloating. If the ulcer results in bleeding, it can cause you to:  Have poop (stool) that is black and looks like tar.  Throw up bright red blood.  Throw up material that looks like coffee grounds. How is this treated? Treatment for this condition may include:  Stopping things that can cause the ulcer, such as: ? Smoking. ? Using pain medicines.  Medicines to reduce stomach acid.  Antibiotic medicines if the ulcer is caused by an infection.  A procedure that is done using a small, flexible tube that has a camera at the end (upper endoscopy). This may be done if you have a bleeding ulcer.  Surgery. This may be needed if: ? You have a lot of bleeding. ? The ulcer caused a hole somewhere in the digestive system. Follow these instructions at home:  Do not drink alcohol if your doctor tells you not to drink.  Limit how much caffeine you take in.  Do not use any products that contain nicotine or tobacco,  such as cigarettes, e-cigarettes, and chewing tobacco. If you need help quitting, ask your doctor.  Take over-the-counter and prescription medicines only as told by your doctor. ? Do not stop or change your medicines unless you talk with your doctor about it first. ? Do not take aspirin, ibuprofen, or other NSAIDs unless your doctor told you to do so.  Keep all follow-up visits as told by your doctor. This is important. Contact a doctor if:  You do not get better in 7 days after you start treatment.  You keep having an upset stomach (indigestion) or heartburn. Get help right away if:  You have sudden, sharp pain in your belly (abdomen).  You have belly pain that does not go away.  You have bloody poop (stool) or black, tarry poop.  You throw up blood. It may look like coffee grounds.  You feel light-headed or feel like you may pass out (faint).  You get weak.  You get sweaty or feel sticky and cold to the touch (clammy). Summary  Symptoms of a peptic ulcer include burning pain in the area between the chest and the belly button.  Take medicines only as told by your doctor.  Limit how much alcohol and caffeine you have.  Keep all follow-up visits as told by your doctor. This information is not intended to replace advice given to you by your health care provider. Make  sure you discuss any questions you have with your health care provider. Document Revised: 08/24/2017 Document Reviewed: 08/24/2017 Elsevier Patient Education  [place]. Gastrointestinal Bleeding Gastrointestinal (GI) bleeding is bleeding somewhere along the path that food travels through the body (digestive tract). This path is anywhere between the mouth and the opening of the butt (anus). You may have blood in your poop (stool) or have black poop. If you throw up (vomit), there may be blood in it. This condition can be mild, serious, or even life-threatening. If you have a lot of bleeding, you may need  to stay in the hospital. What are the causes? This condition may be caused by:  Irritation and swelling of the esophagus (esophagitis). The esophagus is part of the body that moves food from your mouth to your stomach.  Swollen veins in the butt (hemorrhoids).  Areas of painful tearing in the opening of the butt (anal fissures). These are often caused by passing hard poop.  Pouches that form on the colon over time (diverticulosis).  Irritation and swelling (diverticulitis) in areas where pouches have formed on the colon.  Growths (polyps) or cancer. Colon cancer often starts out as growths that are not cancer.  Irritation of the stomach lining (gastritis).  Sores (ulcers) in the stomach. What increases the risk? You are more likely to develop this condition if you:  Have a certain type of infection in your stomach (Helicobacter pylori infection).  Take certain medicines.  Smoke.  Drink alcohol. What are the signs or symptoms? Common symptoms of this condition include:  Throwing up (vomiting) material that has bright red blood in it. It may look like coffee grounds.  Changes in your poop. The poop may: ? Have red blood in it. ? Be black, look like tar, and smell stronger than normal. ? Be red.  Pain or cramping in the belly (abdomen). How is this treated? Treatment for this condition depends on the cause of the bleeding. For example:  Sometimes, the bleeding can be stopped during a procedure that is done to find the problem (endoscopy or colonoscopy).  Medicines can be used to: ? Help control irritation, swelling, or infection. ? Reduce acid in your stomach.  Certain problems can be treated with: ? Creams. ? Medicines that are put in the butt (suppositories). ? Warm baths.  Surgery is sometimes needed.  If you lose a lot of blood, you may need a blood transfusion. If bleeding is mild, you may be allowed to go home. If there is a lot of bleeding, you will need  to stay in the hospital. Follow these instructions at home:   Take over-the-counter and prescription medicines only as told by your doctor.  Eat foods that have a lot of fiber in them. These foods include beans, whole grains, and fresh fruits and vegetables. You can also try eating 1-3 prunes each day.  Drink enough fluid to keep your pee (urine) pale yellow.  Keep all follow-up visits as told by your doctor. This is important. Contact a doctor if:  Your symptoms do not get better. Get help right away if:  Your bleeding does not stop.  You feel dizzy or you pass out (faint).  You feel weak.  You have very bad cramps in your back or belly.  You pass large clumps of blood (clots) in your poop.  Your symptoms are getting worse.  You have chest pain or fast heartbeats. Summary  GI bleeding is bleeding somewhere along the path  that food travels through the body (digestive tract).  This bleeding can be caused by many things. Treatment depends on the cause of the bleeding.  Take medicines only as told by your doctor.  Keep all follow-up visits as told by your doctor. This is important. This information is not intended to replace advice given to you by your health care provider. Make sure you discuss any questions you have with your health care provider. Document Revised: 09/29/2017 Document Reviewed: 09/29/2017 Elsevier Patient Education  [place].

## 2020-01-31 LAB — CBC WITH DIFFERENTIAL/PLATELET
Basophils Absolute: 0 10*3/uL (ref 0.0–0.2)
Basos: 0 %
EOS (ABSOLUTE): 0 10*3/uL (ref 0.0–0.4)
Eos: 0 %
Hematocrit: 39.5 % (ref 34.0–46.6)
Hemoglobin: 13.4 g/dL (ref 11.1–15.9)
Immature Grans (Abs): 0 10*3/uL (ref 0.0–0.1)
Immature Granulocytes: 0 %
Lymphocytes Absolute: 2.1 10*3/uL (ref 0.7–3.1)
Lymphs: 21 %
MCH: 31.1 pg (ref 26.6–33.0)
MCHC: 33.9 g/dL (ref 31.5–35.7)
MCV: 92 fL (ref 79–97)
Monocytes Absolute: 1.4 10*3/uL — ABNORMAL HIGH (ref 0.1–0.9)
Monocytes: 14 %
Neutrophils Absolute: 6.3 10*3/uL (ref 1.4–7.0)
Neutrophils: 65 %
Platelets: 315 10*3/uL (ref 150–450)
RBC: 4.31 x10E6/uL (ref 3.77–5.28)
RDW: 12.6 % (ref 11.7–15.4)
WBC: 9.8 10*3/uL (ref 3.4–10.8)

## 2020-01-31 LAB — COMPREHENSIVE METABOLIC PANEL
ALT: 12 IU/L (ref 0–32)
AST: 17 IU/L (ref 0–40)
Albumin/Globulin Ratio: 2.2 (ref 1.2–2.2)
Albumin: 4.4 g/dL (ref 3.7–4.7)
Alkaline Phosphatase: 98 IU/L (ref 44–121)
BUN/Creatinine Ratio: 14 (ref 12–28)
BUN: 14 mg/dL (ref 8–27)
Bilirubin Total: 0.4 mg/dL (ref 0.0–1.2)
CO2: 25 mmol/L (ref 20–29)
Calcium: 9.8 mg/dL (ref 8.7–10.3)
Chloride: 101 mmol/L (ref 96–106)
Creatinine, Ser: 1.02 mg/dL — ABNORMAL HIGH (ref 0.57–1.00)
GFR calc Af Amer: 63 mL/min/{1.73_m2} (ref >59)
GFR calc non Af Amer: 54 mL/min/{1.73_m2} — ABNORMAL LOW (ref >59)
Globulin, Total: 2 g/dL (ref 1.5–4.5)
Glucose: 103 mg/dL — ABNORMAL HIGH (ref 65–99)
Potassium: 4.7 mmol/L (ref 3.5–5.2)
Sodium: 139 mmol/L (ref 134–144)
Total Protein: 6.4 g/dL (ref 6.0–8.5)

## 2020-01-31 LAB — IRON,TIBC AND FERRITIN PANEL
Ferritin: 17 ng/mL (ref 15–150)
Iron Saturation: 20 % (ref 15–55)
Iron: 69 ug/dL (ref 27–139)
Total Iron Binding Capacity: 344 ug/dL (ref 250–450)
UIBC: 275 ug/dL (ref 118–369)

## 2020-01-31 LAB — IFOBT (OCCULT BLOOD): IFOBT: NEGATIVE

## 2020-01-31 NOTE — Progress Notes (Signed)
Labs stable. WBC resolved back to normal. Mild dehydration likely with kidney function and is stable, hydrate.  Schedule follow up with GI and stay off Mobic and NDAIDS and discussed.  Occult blood in stool negative.

## 2020-02-04 MED ORDER — OMEPRAZOLE 40 MG PO CPDR: 40.0000 mg | DELAYED_RELEASE_CAPSULE | Freq: Every day | ORAL | 3 refills | Status: DC

## 2020-02-20 ENCOUNTER — Ambulatory Visit: Payer: Self-pay | Admitting: Adult Health

## 2020-02-21 ENCOUNTER — Encounter: Payer: Self-pay | Admitting: Family Medicine

## 2020-03-14 ENCOUNTER — Telehealth: Payer: Self-pay

## 2020-03-14 DIAGNOSIS — I10 Essential (primary) hypertension: Secondary | ICD-10-CM

## 2020-03-14 DIAGNOSIS — R195 Other fecal abnormalities: Secondary | ICD-10-CM

## 2020-03-14 MED ORDER — MIRTAZAPINE 30 MG PO TABS
30.0000 mg | ORAL_TABLET | Freq: Every day | ORAL | 3 refills | Status: DC
Start: 2020-03-14 — End: 2020-09-05

## 2020-03-14 MED ORDER — OMEPRAZOLE 40 MG PO CPDR: 40.0000 mg | DELAYED_RELEASE_CAPSULE | Freq: Every day | ORAL | 3 refills | Status: DC

## 2020-03-14 MED ORDER — BISOPROLOL-HYDROCHLOROTHIAZIDE 5-6.25 MG PO TABS: 1.0000 | ORAL_TABLET | Freq: Every day | ORAL | 3 refills | Status: DC

## 2020-03-14 NOTE — Telephone Encounter (Signed)
Patient needs 3 medications refilled:  Bisoprolol-HCTZ 5-6.25 mg.  Mirtazapine 30 mg.  Omeprezole 40 mg.   Send to [facility]

## 2020-03-28 ENCOUNTER — Encounter: Payer: Self-pay | Admitting: Family Medicine

## 2020-03-28 ENCOUNTER — Ambulatory Visit (INDEPENDENT_AMBULATORY_CARE_PROVIDER_SITE_OTHER): Payer: Medicare Other | Admitting: Family Medicine

## 2020-03-28 ENCOUNTER — Other Ambulatory Visit: Payer: Self-pay

## 2020-03-28 VITALS — BP 118/73 | HR 92 | Temp 98.8°F | Resp 16 | Ht 63.0 in | Wt 207.0 lb

## 2020-03-28 DIAGNOSIS — K2100 Gastro-esophageal reflux disease with esophagitis, without bleeding: Secondary | ICD-10-CM

## 2020-03-28 DIAGNOSIS — F5101 Primary insomnia: Secondary | ICD-10-CM | POA: Diagnosis not present

## 2020-03-28 DIAGNOSIS — F324 Major depressive disorder, single episode, in partial remission: Secondary | ICD-10-CM | POA: Diagnosis not present

## 2020-03-28 DIAGNOSIS — I10 Essential (primary) hypertension: Secondary | ICD-10-CM

## 2020-03-28 DIAGNOSIS — E7800 Pure hypercholesterolemia, unspecified: Secondary | ICD-10-CM

## 2020-03-28 DIAGNOSIS — Z6836 Body mass index (BMI) 36.0-36.9, adult: Secondary | ICD-10-CM

## 2020-03-28 DIAGNOSIS — E039 Hypothyroidism, unspecified: Secondary | ICD-10-CM

## 2020-03-28 DIAGNOSIS — R7303 Prediabetes: Secondary | ICD-10-CM | POA: Diagnosis not present

## 2020-03-28 LAB — POCT GLYCOSYLATED HEMOGLOBIN (HGB A1C)
Est. average glucose Bld gHb Est-mCnc: 117
Hemoglobin A1C: 5.7 % — AB (ref 4.0–5.6)

## 2020-03-28 NOTE — Progress Notes (Signed)
I,[name],acting as a scribe for [name], MD.,have documented all relevant documentation on the behalf of [name], MD,as directed by  [name], MD while in the presence of [name], MD.  Established patient visit   Patient: Jasmine Rich   DOB: 05-29-1945   75 y.o. Female  MRN: [id number] Visit Date: 03/28/2020  Today's healthcare provider: [name], MD   Chief Complaint  Patient presents with  . Follow-up  . Depression  . Hypertension  . Prediabetes   Subjective    HPI  Patient states she feels better since doubling the Effexor dose.  Things are much more under control. Depression, Follow-up  She  was last seen for this 3 months ago. Changes made at last visit include; Increased Effexor from 75 mg to 150 mg qd.   She reports good compliance with treatment. She is not having side effects. none  She reports good tolerance of treatment. Current symptoms include: weight gain She feels she is Improved since last visit.  Depression screen [facility] 2/9 12/27/2019 11/01/2019 07/25/2019  Decreased Interest 0 0 2  Down, Depressed, Hopeless 0 0 1  PHQ - 2 Score 0 0 3  Altered sleeping 1 - 0  Tired, decreased energy 0 - 2  Change in appetite 0 - 2  Feeling bad or failure about yourself  0 - 1  Trouble concentrating 0 - 0  Moving slowly or fidgety/restless 0 - 0  Suicidal thoughts 0 - 0  PHQ-9 Score 1 - 8  Difficult doing work/chores Not difficult at all - Not difficult at all    --------------------------------------------------------------------  Hypertension, follow-up  BP Readings from Last 3 Encounters:  03/28/20 118/73  01/30/20 104/79  01/04/20 131/79   Wt Readings from Last 3 Encounters:  03/28/20 207 lb (93.9 kg)  01/30/20 209 lb 9.6 oz (95.1 kg)  01/04/20 210 lb 3.2 oz (95.3 kg)     She was last seen for hypertension 3 months ago.  BP at that visit was 108/78. Management since that visit includes; on  bisoprolol-hydrochlorothiazide.  She reports good compliance with treatment. She is not having side effects. none She is not exercising. She is not adherent to low salt diet.   Outside blood pressures are not checking.  She does not smoke.  Use of agents associated with hypertension: none.   --------------------------------------------------------------------  Prediabetes, Follow-up  Lab Results  Component Value Date   HGBA1C 6.0 (A) 12/27/2019   HGBA1C 6.2 (A) 07/25/2019   HGBA1C 6.2 (H) 03/27/2019   GLUCOSE 103 (H) 01/30/2020   GLUCOSE 115 (H) 12/28/2019   GLUCOSE 97 09/27/2019    Last seen for for this3 months ago.  Management since that visit includes; no changes were made. Current symptoms include none and have been unchanged.  Prior visit with dietician: no Current diet: well balanced Current exercise: none  Pertinent Labs:    Component Value Date/Time   CHOL 147 12/28/2019 0839   TRIG 117 12/28/2019 0839   CHOLHDL 4.0 12/28/2019 0839   CREATININE 1.02 (H) 01/30/2020 1100    Wt Readings from Last 3 Encounters:  03/28/20 207 lb (93.9 kg)  01/30/20 209 lb 9.6 oz (95.1 kg)  01/04/20 210 lb 3.2 oz (95.3 kg)   He does have chronic arthritic type pains but is unable to take anti-inflammatories because of possible PUD with this. --------------------------------------------------------------------      Medications: Outpatient Medications Prior to Visit  Medication Sig  .  bisoprolol-hydrochlorothiazide (ZIAC) 5-6.25 MG tablet Take 1 tablet by mouth daily.  . cephALEXin (KEFLEX) 500 MG capsule Take 1 capsule (500 mg total) by mouth 3 (three) times daily.  . Cyanocobalamin 2500 MCG CHEW Chew 3,000 mcg by mouth daily.  . ferrous sulfate 325 (65 FE) MG tablet Take by mouth as needed. Reported on 03/06/2015  . levothyroxine (SYNTHROID) 150 MCG tablet Take 1 tablet (150 mcg total) by mouth daily.  Marland Kitchen LORazepam (ATIVAN) 0.5 MG tablet Take 1 tablet (0.5 mg total) by  mouth at bedtime.  . meloxicam (MOBIC) 7.5 MG tablet Take 1 tablet (7.5 mg total) by mouth daily.  . mirtazapine (REMERON) 30 MG tablet Take 1 tablet (30 mg total) by mouth at bedtime.  . Multiple Vitamin (MULTIVITAMIN) tablet Take 1 tablet by mouth daily. occasionally   . omeprazole (PRILOSEC) 40 MG capsule Take 1 capsule (40 mg total) by mouth daily.  . rosuvastatin (CRESTOR) 10 MG tablet Take 1 tablet (10 mg total) by mouth daily.  Marland Kitchen venlafaxine XR (EFFEXOR-XR) 150 MG 24 hr capsule Take 1 capsule (150 mg total) by mouth daily with breakfast.  . acetaminophen (TYLENOL) 500 MG tablet Take 500 mg by mouth every 6 (six) hours as needed.  (Patient not taking: No sig reported)  . albuterol (VENTOLIN HFA) 108 (90 Base) MCG/ACT inhaler Inhale 2 puffs into the lungs every 6 (six) hours as needed for wheezing or shortness of breath. (Patient not taking: Reported on 03/28/2020)  . DULoxetine (CYMBALTA) 60 MG capsule Take 1 capsule (60 mg total) by mouth daily. (Patient not taking: No sig reported)  . estradiol (ESTRACE) 0.5 MG tablet Take 2 tablets (1 mg total) by mouth daily. (Patient not taking: No sig reported)  . TURMERIC PO Take 500 mg by mouth daily. Unsure dose (Patient not taking: No sig reported)  . valACYclovir (VALTREX) 1000 MG tablet SMARTSIG:1 Tablet(s) By Mouth Every 12 Hours (Patient not taking: No sig reported)  . VITAMIN D PO Take by mouth. (Patient not taking: No sig reported)   No facility-administered medications prior to visit.    Review of Systems  Constitutional: Negative for appetite change, chills, fatigue and fever.  Respiratory: Negative for chest tightness and shortness of breath.   Cardiovascular: Negative for chest pain and palpitations.  Gastrointestinal: Negative for abdominal pain, nausea and vomiting.  Neurological: Negative for dizziness and weakness.        Objective    BP 118/73 (BP Location: Right Arm, Patient Position: Sitting, Cuff Size: Large)   Pulse 92    Temp 98.8 F (37.1 C) (Oral)   Resp 16   Ht 5\' 3"  (1.6 m)   Wt 207 lb (93.9 kg)   SpO2 96%   BMI 36.67 kg/m  BP Readings from Last 3 Encounters:  03/28/20 118/73  01/30/20 104/79  01/04/20 131/79   Wt Readings from Last 3 Encounters:  03/28/20 207 lb (93.9 kg)  01/30/20 209 lb 9.6 oz (95.1 kg)  01/04/20 210 lb 3.2 oz (95.3 kg)       Physical Exam Vitals reviewed.  Constitutional:      Appearance: She is well-developed. She is obese.  HENT:     Head: Normocephalic and atraumatic.     Right Ear: External ear normal.     Left Ear: External ear normal.     Nose: Nose normal.  Eyes:     General: No scleral icterus.    Conjunctiva/sclera: Conjunctivae normal.  Neck:     Thyroid: No thyromegaly.  Vascular: No carotid bruit.  Cardiovascular:     Rate and Rhythm: Normal rate and regular rhythm.     Heart sounds: Normal heart sounds.  Pulmonary:     Effort: Pulmonary effort is normal.     Breath sounds: Normal breath sounds.  Chest:  Breasts:     Right: Normal.     Left: Normal.    Abdominal:     Palpations: Abdomen is soft.  Skin:    General: Skin is warm and dry.  Neurological:     General: No focal deficit present.     Mental Status: She is alert and oriented to person, place, and time.  Psychiatric:        Mood and Affect: Mood normal.        Behavior: Behavior normal.        Thought Content: Thought content normal.        Judgment: Judgment normal.       No results found for any visits on 03/28/20.  Assessment & Plan     1. Prediabetes A1c under excellent control at 5.7. - POCT glycosylated hemoglobin (Hb A1C)  2. Essential (primary) hypertension Blood pressure well controlled.  3. Gastroesophageal reflux disease with esophagitis without hemorrhage Continue omeprazole for symptoms.  4. Acquired hypothyroidism   5. Class 2 severe obesity due to excess calories with serious comorbidity and body mass index (BMI) of 36.0 to 36.9 in adult  [facility]) Obesity with hyperglycemia, hypertension, reflux, hyperlipidemia  6. Major depressive disorder with single episode, in partial remission (HCC) Improved on increased Effexor dose to 150 mg daily  7. Primary insomnia On mirtazapine.  May try to stop that in the future.  8. Pure hypercholesterolemia On rosuvastatin 10   No follow-ups on file.      I, [name], MD, have reviewed all documentation for this visit. The documentation on 04/03/20 for the exam, diagnosis, procedures, and orders are all accurate and complete.    Rebeca Valdivia Cranford Mon, MD  [facility] [phone] (phone) [phone] (fax)  [place]

## 2020-04-02 DIAGNOSIS — M9901 Segmental and somatic dysfunction of cervical region: Secondary | ICD-10-CM | POA: Diagnosis not present

## 2020-04-02 DIAGNOSIS — M9903 Segmental and somatic dysfunction of lumbar region: Secondary | ICD-10-CM | POA: Diagnosis not present

## 2020-04-02 DIAGNOSIS — M5033 Other cervical disc degeneration, cervicothoracic region: Secondary | ICD-10-CM | POA: Diagnosis not present

## 2020-04-02 DIAGNOSIS — R519 Headache, unspecified: Secondary | ICD-10-CM | POA: Diagnosis not present

## 2020-04-10 DIAGNOSIS — R519 Headache, unspecified: Secondary | ICD-10-CM | POA: Diagnosis not present

## 2020-04-10 DIAGNOSIS — M5033 Other cervical disc degeneration, cervicothoracic region: Secondary | ICD-10-CM | POA: Diagnosis not present

## 2020-04-10 DIAGNOSIS — M9901 Segmental and somatic dysfunction of cervical region: Secondary | ICD-10-CM | POA: Diagnosis not present

## 2020-04-10 DIAGNOSIS — M9903 Segmental and somatic dysfunction of lumbar region: Secondary | ICD-10-CM | POA: Diagnosis not present

## 2020-04-22 DIAGNOSIS — R519 Headache, unspecified: Secondary | ICD-10-CM | POA: Diagnosis not present

## 2020-04-22 DIAGNOSIS — M5033 Other cervical disc degeneration, cervicothoracic region: Secondary | ICD-10-CM | POA: Diagnosis not present

## 2020-04-22 DIAGNOSIS — M9903 Segmental and somatic dysfunction of lumbar region: Secondary | ICD-10-CM | POA: Diagnosis not present

## 2020-04-22 DIAGNOSIS — M9901 Segmental and somatic dysfunction of cervical region: Secondary | ICD-10-CM | POA: Diagnosis not present

## 2020-08-26 ENCOUNTER — Ambulatory Visit: Payer: Self-pay | Admitting: Family Medicine

## 2020-09-05 ENCOUNTER — Ambulatory Visit (INDEPENDENT_AMBULATORY_CARE_PROVIDER_SITE_OTHER): Payer: Medicare Other | Admitting: Family Medicine

## 2020-09-05 ENCOUNTER — Encounter: Payer: Self-pay | Admitting: Family Medicine

## 2020-09-05 ENCOUNTER — Other Ambulatory Visit: Payer: Self-pay

## 2020-09-05 VITALS — BP 113/71 | HR 84 | Temp 97.9°F | Resp 16 | Ht 64.0 in | Wt 205.0 lb

## 2020-09-05 DIAGNOSIS — E039 Hypothyroidism, unspecified: Secondary | ICD-10-CM

## 2020-09-05 DIAGNOSIS — Z6834 Body mass index (BMI) 34.0-34.9, adult: Secondary | ICD-10-CM

## 2020-09-05 DIAGNOSIS — F324 Major depressive disorder, single episode, in partial remission: Secondary | ICD-10-CM

## 2020-09-05 DIAGNOSIS — E7800 Pure hypercholesterolemia, unspecified: Secondary | ICD-10-CM

## 2020-09-05 DIAGNOSIS — I10 Essential (primary) hypertension: Secondary | ICD-10-CM | POA: Diagnosis not present

## 2020-09-05 DIAGNOSIS — H903 Sensorineural hearing loss, bilateral: Secondary | ICD-10-CM | POA: Diagnosis not present

## 2020-09-05 DIAGNOSIS — K219 Gastro-esophageal reflux disease without esophagitis: Secondary | ICD-10-CM

## 2020-09-05 DIAGNOSIS — E6609 Other obesity due to excess calories: Secondary | ICD-10-CM | POA: Diagnosis not present

## 2020-09-05 DIAGNOSIS — N951 Menopausal and female climacteric states: Secondary | ICD-10-CM | POA: Diagnosis not present

## 2020-09-05 MED ORDER — ROSUVASTATIN CALCIUM 10 MG PO TABS: 10.0000 mg | ORAL_TABLET | Freq: Every day | ORAL | 3 refills | Status: DC

## 2020-09-05 MED ORDER — LEVOTHYROXINE SODIUM 150 MCG PO TABS: 150.0000 ug | ORAL_TABLET | Freq: Every day | ORAL | 3 refills | Status: DC

## 2020-09-05 MED ORDER — VENLAFAXINE HCL ER 150 MG PO CP24: 150.0000 mg | ORAL_CAPSULE | Freq: Every day | ORAL | 3 refills | Status: DC

## 2020-09-05 MED ORDER — MIRTAZAPINE 30 MG PO TABS: 30.0000 mg | ORAL_TABLET | Freq: Every day | ORAL | 3 refills | Status: DC

## 2020-09-05 NOTE — Progress Notes (Signed)
Established patient visit   Patient: Jasmine Rich   DOB: January 25, 1946   75 y.o. Female  MRN: [id number] Visit Date: 09/05/2020  Today's healthcare provider: [name], MD   Chief Complaint  Patient presents with   Hypertension   Hypothyroidism   Hyperlipidemia   Subjective    HPI  Patient comes in today for follow-up.  Everything is stable and overall she is feeling fairly well. She feels safe at home with her husband with PTSD.  She is less anxious than in past years. She is taking her medications as prescribed and is tolerating everything well. Hypertension, follow-up  BP Readings from Last 3 Encounters:  09/05/20 113/71  03/28/20 118/73  01/30/20 104/79   Wt Readings from Last 3 Encounters:  09/05/20 205 lb (93 kg)  03/28/20 207 lb (93.9 kg)  01/30/20 209 lb 9.6 oz (95.1 kg)     She was last seen for hypertension 6 months ago.  BP at that visit was 118/73. Management since that visit includes no medication changes.  She reports good compliance with treatment. She is not having side effects.  She is following a Regular diet. She is not exercising. She does not smoke.  Use of agents associated with hypertension: none.   Outside blood pressures are checked occasionally. Symptoms: No chest pain No chest pressure  No palpitations No syncope  No dyspnea No orthopnea  No paroxysmal nocturnal dyspnea No lower extremity edema   Pertinent labs: Lab Results  Component Value Date   CHOL 147 12/28/2019   HDL 37 (L) 12/28/2019   LDLCALC 89 12/28/2019   TRIG 117 12/28/2019   CHOLHDL 4.0 12/28/2019   Lab Results  Component Value Date   NA 139 01/30/2020   K 4.7 01/30/2020   CREATININE 1.02 (H) 01/30/2020   GFRNONAA 54 (L) 01/30/2020   GFRAA 63 01/30/2020   GLUCOSE 103 (H) 01/30/2020     The 10-year ASCVD risk score Mikey Bussing DC Jr., et al., 2013) is: 16.2%   Lipid/Cholesterol, Follow-up  Last lipid panel Other pertinent labs  Lab Results   Component Value Date   CHOL 147 12/28/2019   HDL 37 (L) 12/28/2019   LDLCALC 89 12/28/2019   TRIG 117 12/28/2019   CHOLHDL 4.0 12/28/2019   Lab Results  Component Value Date   ALT 12 01/30/2020   AST 17 01/30/2020   PLT 315 01/30/2020   TSH 1.180 12/28/2019     She was last seen for this 6 months ago.  Management since that visit includes no medication changes.  She reports good compliance with treatment. She is not having side effects.   Hypothyroid, follow-up  Lab Results  Component Value Date   TSH 1.180 12/28/2019   TSH 9.120 (H) 09/27/2019   TSH 4.010 03/27/2019   Wt Readings from Last 3 Encounters:  09/05/20 205 lb (93 kg)  03/28/20 207 lb (93.9 kg)  01/30/20 209 lb 9.6 oz (95.1 kg)    She was last seen for hypothyroid 6 months ago.  Management since that visit includes no medication changes. She reports good compliance with treatment. She is not having side effects.   Symptoms: No change in energy level No constipation  No diarrhea No heat / cold intolerance  No nervousness No palpitations  No weight changes        Medications: Outpatient Medications Prior to Visit  Medication Sig   bisoprolol-hydrochlorothiazide (ZIAC) 5-6.25 MG tablet Take 1 tablet by mouth daily.  acetaminophen (TYLENOL) 500 MG tablet Take 500 mg by mouth every 6 (six) hours as needed.  (Patient not taking: No sig reported)   albuterol (VENTOLIN HFA) 108 (90 Base) MCG/ACT inhaler Inhale 2 puffs into the lungs every 6 (six) hours as needed for wheezing or shortness of breath. (Patient not taking: No sig reported)   cephALEXin (KEFLEX) 500 MG capsule Take 1 capsule (500 mg total) by mouth 3 (three) times daily.   Cyanocobalamin 2500 MCG CHEW Chew 3,000 mcg by mouth daily.   DULoxetine (CYMBALTA) 60 MG capsule Take 1 capsule (60 mg total) by mouth daily. (Patient not taking: No sig reported)   estradiol (ESTRACE) 0.5 MG tablet Take 2 tablets (1 mg total) by mouth daily. (Patient not  taking: No sig reported)   ferrous sulfate 325 (65 FE) MG tablet Take by mouth as needed. Reported on 03/06/2015   levothyroxine (SYNTHROID) 150 MCG tablet Take 1 tablet (150 mcg total) by mouth daily.   LORazepam (ATIVAN) 0.5 MG tablet Take 1 tablet (0.5 mg total) by mouth at bedtime.   meloxicam (MOBIC) 7.5 MG tablet Take 1 tablet (7.5 mg total) by mouth daily.   mirtazapine (REMERON) 30 MG tablet Take 1 tablet (30 mg total) by mouth at bedtime.   Multiple Vitamin (MULTIVITAMIN) tablet Take 1 tablet by mouth daily. occasionally    omeprazole (PRILOSEC) 40 MG capsule Take 1 capsule (40 mg total) by mouth daily.   rosuvastatin (CRESTOR) 10 MG tablet Take 1 tablet (10 mg total) by mouth daily.   TURMERIC PO Take 500 mg by mouth daily. Unsure dose (Patient not taking: No sig reported)   valACYclovir (VALTREX) 1000 MG tablet SMARTSIG:1 Tablet(s) By Mouth Every 12 Hours (Patient not taking: No sig reported)   venlafaxine XR (EFFEXOR-XR) 150 MG 24 hr capsule Take 1 capsule (150 mg total) by mouth daily with breakfast.   VITAMIN D PO Take by mouth. (Patient not taking: No sig reported)   No facility-administered medications prior to visit.    Review of Systems  Constitutional:  Negative for activity change and fatigue.  Respiratory:  Negative for cough and shortness of breath.   Cardiovascular:  Negative for chest pain, palpitations and leg swelling.  Endocrine: Negative for cold intolerance, heat intolerance, polydipsia, polyphagia and polyuria.  Musculoskeletal:  Negative for arthralgias and joint swelling.  Allergic/Immunologic: Negative for environmental allergies.  Neurological:  Negative for dizziness, light-headedness and headaches.       Objective    BP 113/71   Pulse 84   Temp 97.9 F (36.6 C)   Resp 16   Ht 5\' 4"  (1.626 m)   Wt 205 lb (93 kg)   BMI 35.19 kg/m  BP Readings from Last 3 Encounters:  09/05/20 113/71  03/28/20 118/73  01/30/20 104/79   Wt Readings from Last 3  Encounters:  09/05/20 205 lb (93 kg)  03/28/20 207 lb (93.9 kg)  01/30/20 209 lb 9.6 oz (95.1 kg)       Physical Exam Vitals reviewed.  Constitutional:      Appearance: She is well-developed. She is obese.  HENT:     Head: Normocephalic and atraumatic.     Right Ear: External ear normal.     Left Ear: External ear normal.     Nose: Nose normal.  Eyes:     General: No scleral icterus.    Conjunctiva/sclera: Conjunctivae normal.  Neck:     Thyroid: No thyromegaly.     Vascular: No carotid bruit.  Cardiovascular:     Rate and Rhythm: Normal rate and regular rhythm.     Heart sounds: Normal heart sounds.  Pulmonary:     Effort: Pulmonary effort is normal.     Breath sounds: Normal breath sounds.  Chest:  Breasts:    Right: Normal.     Left: Normal.  Abdominal:     Palpations: Abdomen is soft.  Musculoskeletal:     Right lower leg: No edema.     Left lower leg: No edema.  Skin:    General: Skin is warm and dry.  Neurological:     General: No focal deficit present.     Mental Status: She is alert and oriented to person, place, and time.  Psychiatric:        Mood and Affect: Mood normal.        Behavior: Behavior normal.        Thought Content: Thought content normal.        Judgment: Judgment normal.      No results found for any visits on 09/05/20.  Assessment & Plan     1. Hypercholesteremia Continue Crestor - rosuvastatin (CRESTOR) 10 MG tablet; Take 1 tablet (10 mg total) by mouth daily.  Dispense: 90 tablet; Refill: 3  2. Major depressive disorder with single episode, in partial remission (HCC) Continue venlafaxine.  Continue mirtazapine - venlafaxine XR (EFFEXOR-XR) 150 MG 24 hr capsule; Take 1 capsule (150 mg total) by mouth daily with breakfast.  Dispense: 90 capsule; Refill: 3  3. Perimenopausal vasomotor symptoms Hopefully help with venlafaxine  4. Essential (primary) hypertension On [place]  5. Gastroesophageal reflux disease without  esophagitis Continue daily omeprazole  6. Acquired hypothyroidism Follow TSH  7. Sensorineural hearing loss (SNHL) of both ears   8. Pure hypercholesterolemia   9. Class 1 obesity due to excess calories with serious comorbidity and body mass index (BMI) of 34.0 to 34.9 in adult With hypertension hyperlipidemia reflux.  Patient has lost a couple of pounds and her praised her for this.  Continue to work on diet and exercise.   No follow-ups on file.      I, [name], MD, have reviewed all documentation for this visit. The documentation on 09/11/20 for the exam, diagnosis, procedures, and orders are all accurate and complete.    Lekendrick Alpern Cranford Mon, MD  [facility] [phone] (phone) [phone] (fax)  [place]

## 2020-09-09 ENCOUNTER — Other Ambulatory Visit: Payer: Self-pay | Admitting: Family Medicine

## 2020-09-09 DIAGNOSIS — Z1231 Encounter for screening mammogram for malignant neoplasm of breast: Secondary | ICD-10-CM

## 2020-09-25 DIAGNOSIS — M9902 Segmental and somatic dysfunction of thoracic region: Secondary | ICD-10-CM | POA: Diagnosis not present

## 2020-09-25 DIAGNOSIS — M9901 Segmental and somatic dysfunction of cervical region: Secondary | ICD-10-CM | POA: Diagnosis not present

## 2020-09-25 DIAGNOSIS — M5033 Other cervical disc degeneration, cervicothoracic region: Secondary | ICD-10-CM | POA: Diagnosis not present

## 2020-09-25 DIAGNOSIS — M62830 Muscle spasm of back: Secondary | ICD-10-CM | POA: Diagnosis not present

## 2020-09-30 ENCOUNTER — Ambulatory Visit
Admission: RE | Admit: 2020-09-30 | Discharge: 2020-09-30 | Disposition: A | Discharge status: Home / Self Care | Payer: Medicare Other | Source: Ambulatory Visit | Attending: Family Medicine | Admitting: Family Medicine

## 2020-09-30 ENCOUNTER — Other Ambulatory Visit: Payer: Self-pay

## 2020-09-30 DIAGNOSIS — Z1231 Encounter for screening mammogram for malignant neoplasm of breast: Secondary | ICD-10-CM | POA: Diagnosis not present

## 2020-10-03 DIAGNOSIS — D225 Melanocytic nevi of trunk: Secondary | ICD-10-CM | POA: Diagnosis not present

## 2020-10-03 DIAGNOSIS — L570 Actinic keratosis: Secondary | ICD-10-CM | POA: Diagnosis not present

## 2020-10-03 DIAGNOSIS — D2271 Melanocytic nevi of right lower limb, including hip: Secondary | ICD-10-CM | POA: Diagnosis not present

## 2020-10-03 DIAGNOSIS — D2272 Melanocytic nevi of left lower limb, including hip: Secondary | ICD-10-CM | POA: Diagnosis not present

## 2020-10-03 DIAGNOSIS — X32XXXA Exposure to sunlight, initial encounter: Secondary | ICD-10-CM | POA: Diagnosis not present

## 2020-10-03 DIAGNOSIS — D2261 Melanocytic nevi of right upper limb, including shoulder: Secondary | ICD-10-CM | POA: Diagnosis not present

## 2020-10-03 DIAGNOSIS — L821 Other seborrheic keratosis: Secondary | ICD-10-CM | POA: Diagnosis not present

## 2020-10-03 DIAGNOSIS — D2262 Melanocytic nevi of left upper limb, including shoulder: Secondary | ICD-10-CM | POA: Diagnosis not present

## 2020-10-03 DIAGNOSIS — H0260 Xanthelasma of unspecified eye, unspecified eyelid: Secondary | ICD-10-CM | POA: Diagnosis not present

## 2020-10-03 DIAGNOSIS — D485 Neoplasm of uncertain behavior of skin: Secondary | ICD-10-CM | POA: Diagnosis not present

## 2020-11-21 DIAGNOSIS — Z4802 Encounter for removal of sutures: Secondary | ICD-10-CM | POA: Diagnosis not present

## 2020-11-25 DIAGNOSIS — T8140XA Infection following a procedure, unspecified, initial encounter: Secondary | ICD-10-CM | POA: Diagnosis not present

## 2020-12-25 DIAGNOSIS — Z23 Encounter for immunization: Secondary | ICD-10-CM | POA: Diagnosis not present

## 2020-12-27 DIAGNOSIS — Z20822 Contact with and (suspected) exposure to covid-19: Secondary | ICD-10-CM | POA: Diagnosis not present

## 2021-01-07 ENCOUNTER — Other Ambulatory Visit: Payer: Self-pay | Admitting: Adult Health

## 2021-01-07 DIAGNOSIS — D72829 Elevated white blood cell count, unspecified: Secondary | ICD-10-CM

## 2021-03-11 ENCOUNTER — Ambulatory Visit (INDEPENDENT_AMBULATORY_CARE_PROVIDER_SITE_OTHER): Payer: Medicare Other | Admitting: Family Medicine

## 2021-03-11 ENCOUNTER — Encounter: Payer: Self-pay | Admitting: Family Medicine

## 2021-03-11 ENCOUNTER — Other Ambulatory Visit: Payer: Self-pay

## 2021-03-11 ENCOUNTER — Ambulatory Visit: Payer: Self-pay | Admitting: Family Medicine

## 2021-03-11 VITALS — BP 112/77 | HR 82 | Temp 98.3°F | Resp 16 | Ht 64.0 in | Wt 201.0 lb

## 2021-03-11 DIAGNOSIS — F32A Depression, unspecified: Secondary | ICD-10-CM

## 2021-03-11 DIAGNOSIS — K219 Gastro-esophageal reflux disease without esophagitis: Secondary | ICD-10-CM

## 2021-03-11 DIAGNOSIS — E538 Deficiency of other specified B group vitamins: Secondary | ICD-10-CM | POA: Diagnosis not present

## 2021-03-11 DIAGNOSIS — F419 Anxiety disorder, unspecified: Secondary | ICD-10-CM | POA: Diagnosis not present

## 2021-03-11 DIAGNOSIS — E7800 Pure hypercholesterolemia, unspecified: Secondary | ICD-10-CM

## 2021-03-11 DIAGNOSIS — I10 Essential (primary) hypertension: Secondary | ICD-10-CM | POA: Diagnosis not present

## 2021-03-11 DIAGNOSIS — E039 Hypothyroidism, unspecified: Secondary | ICD-10-CM

## 2021-03-11 DIAGNOSIS — E6609 Other obesity due to excess calories: Secondary | ICD-10-CM

## 2021-03-11 DIAGNOSIS — R7303 Prediabetes: Secondary | ICD-10-CM

## 2021-03-11 DIAGNOSIS — Z6834 Body mass index (BMI) 34.0-34.9, adult: Secondary | ICD-10-CM

## 2021-03-11 DIAGNOSIS — R195 Other fecal abnormalities: Secondary | ICD-10-CM | POA: Diagnosis not present

## 2021-03-11 DIAGNOSIS — D649 Anemia, unspecified: Secondary | ICD-10-CM | POA: Diagnosis not present

## 2021-03-11 DIAGNOSIS — F324 Major depressive disorder, single episode, in partial remission: Secondary | ICD-10-CM

## 2021-03-11 DIAGNOSIS — J4520 Mild intermittent asthma, uncomplicated: Secondary | ICD-10-CM

## 2021-03-11 MED ORDER — OMEPRAZOLE 40 MG PO CPDR: 40.0000 mg | DELAYED_RELEASE_CAPSULE | Freq: Every day | ORAL | 3 refills | Status: AC

## 2021-03-11 MED ORDER — ALBUTEROL SULFATE HFA 108 (90 BASE) MCG/ACT IN AERS: 2.0000 | INHALATION_SPRAY | Freq: Four times a day (QID) | RESPIRATORY_TRACT | 2 refills | Status: DC | PRN

## 2021-03-11 MED ORDER — BISOPROLOL-HYDROCHLOROTHIAZIDE 5-6.25 MG PO TABS: 1.0000 | ORAL_TABLET | Freq: Every day | ORAL | 3 refills | Status: DC

## 2021-03-11 NOTE — Assessment & Plan Note (Signed)
Congratulated efforts with weight loss. Checking labs.

## 2021-03-11 NOTE — Assessment & Plan Note (Addendum)
Doing well on current regimen, no changes made today. Discussed potential harms of continuing PPI long term, recommend prn use if able.

## 2021-03-11 NOTE — Assessment & Plan Note (Signed)
Recheck labs. Congratulated efforts with weight loss.

## 2021-03-11 NOTE — Assessment & Plan Note (Signed)
Doing well on current regimen, no changes made today. Refill provided.

## 2021-03-11 NOTE — Assessment & Plan Note (Signed)
Recheck labs and adjust as indicated.  

## 2021-03-11 NOTE — Progress Notes (Signed)
° ° °  SUBJECTIVE:   CHIEF COMPLAINT / HPI:   Hypertension: - Medications: bisoprolol-HCTZ - Compliance: good - Checking BP at home: no - Denies any LE edema, medication SEs, or symptoms of hypotension  GERD - Meds: omeprazole daily - Symptoms:  heartburn -  denies dysphagia  denies melena, hematochezia, hematemesis, and coffee ground emesis.   Hypothyroidism - Medications: Synthroid 167mcg - Current symptoms:  none - Denies change in energy level, diarrhea, heat / cold intolerance, nervousness, and palpitations - Symptoms have been basically asymptomatic  Anxiety/Depression - Medications: effexor, remeron - Taking: good compliance - Counseling: no - Previous hospitalizations: none - Symptoms: none - Current stressors: husband with PTSD - Coping Mechanisms: goes to the beach, stays with a friend  Depression screen [facility] 2/9 03/11/2021 09/05/2020 12/27/2019  Decreased Interest 0 0 0  Down, Depressed, Hopeless 0 0 0  PHQ - 2 Score 0 0 0  Altered sleeping 0 0 1  Tired, decreased energy 0 0 0  Change in appetite 0 0 0  Feeling bad or failure about yourself  0 0 0  Trouble concentrating 0 0 0  Moving slowly or fidgety/restless 0 0 0  Suicidal thoughts 0 0 0  PHQ-9 Score 0 0 1  Difficult doing work/chores - Not difficult at all Not difficult at all   GAD 7 : Generalized Anxiety Score 03/11/2021 01/05/2019  Nervous, Anxious, on Edge 3 2  Control/stop worrying 0 0  Worry too much - different things 0 2  Trouble relaxing 0 0  Restless 0 0  Easily annoyed or irritable 0 0  Afraid - awful might happen 0 3  Total GAD 7 Score 3 7  Anxiety Difficulty - Somewhat difficult     OBJECTIVE:   BP 112/77 (BP Location: Right Arm, Patient Position: Sitting, Cuff Size: Large)    Pulse 82    Temp 98.3 F (36.8 C) (Temporal)    Resp 16    Ht 5\' 4"  (1.626 m)    Wt 201 lb (91.2 kg)    SpO2 96%    BMI 34.50 kg/m   Gen: well appearing, in NAD Card: RRR Lungs: CTAB Ext: WWP, no  edema   ASSESSMENT/PLAN:   Essential (primary) hypertension Doing well on current regimen, no changes made today. Refill provided.  Gastroesophageal reflux disease without esophagitis Doing well on current regimen, no changes made today. Discussed potential harms of continuing PPI long term, recommend prn use if able.  Acquired hypothyroidism Recheck labs and adjust as indicated.  Adiposity Congratulated efforts with weight loss. Checking labs.  Anemia Recheck labs.  Anxiety Doing well on current regimen, no changes made today.  Clinical depression Doing well on current regimen, no changes made today.  Hypercholesteremia Recheck labs. Congratulated efforts with weight loss.      Myles Gip, DO

## 2021-03-11 NOTE — Assessment & Plan Note (Signed)
Recheck labs 

## 2021-03-11 NOTE — Assessment & Plan Note (Signed)
Doing well on current regimen, no changes made today. 

## 2021-03-12 DIAGNOSIS — Z6834 Body mass index (BMI) 34.0-34.9, adult: Secondary | ICD-10-CM | POA: Diagnosis not present

## 2021-03-12 DIAGNOSIS — E6609 Other obesity due to excess calories: Secondary | ICD-10-CM | POA: Diagnosis not present

## 2021-03-12 DIAGNOSIS — E7800 Pure hypercholesterolemia, unspecified: Secondary | ICD-10-CM | POA: Diagnosis not present

## 2021-03-12 DIAGNOSIS — R7303 Prediabetes: Secondary | ICD-10-CM | POA: Diagnosis not present

## 2021-03-12 DIAGNOSIS — E538 Deficiency of other specified B group vitamins: Secondary | ICD-10-CM | POA: Diagnosis not present

## 2021-03-13 LAB — BASIC METABOLIC PANEL
BUN/Creatinine Ratio: 14 (ref 12–28)
BUN: 15 mg/dL (ref 8–27)
CO2: 26 mmol/L (ref 20–29)
Calcium: 9.5 mg/dL (ref 8.7–10.3)
Chloride: 100 mmol/L (ref 96–106)
Creatinine, Ser: 1.1 mg/dL — ABNORMAL HIGH (ref 0.57–1.00)
Glucose: 124 mg/dL — ABNORMAL HIGH (ref 70–99)
Potassium: 4.8 mmol/L (ref 3.5–5.2)
Sodium: 139 mmol/L (ref 134–144)
eGFR: 52 mL/min/{1.73_m2} — ABNORMAL LOW (ref >59)

## 2021-03-13 LAB — VITAMIN B12: Vitamin B-12: 343 pg/mL (ref 232–1245)

## 2021-03-13 LAB — HEMOGLOBIN A1C
Est. average glucose Bld gHb Est-mCnc: 137 mg/dL
Hgb A1c MFr Bld: 6.4 % — ABNORMAL HIGH (ref 4.8–5.6)

## 2021-03-13 LAB — CBC
Hematocrit: 41.8 % (ref 34.0–46.6)
Hemoglobin: 14 g/dL (ref 11.1–15.9)
MCH: 29.3 pg (ref 26.6–33.0)
MCHC: 33.5 g/dL (ref 31.5–35.7)
MCV: 87 fL (ref 79–97)
Platelets: 328 10*3/uL (ref 150–450)
RBC: 4.78 x10E6/uL (ref 3.77–5.28)
RDW: 13.7 % (ref 11.7–15.4)
WBC: 7.3 10*3/uL (ref 3.4–10.8)

## 2021-03-13 LAB — LIPID PANEL
Chol/HDL Ratio: 4.7 ratio — ABNORMAL HIGH (ref 0.0–4.4)
Cholesterol, Total: 137 mg/dL (ref 100–199)
HDL: 29 mg/dL — ABNORMAL LOW (ref >39)
LDL Chol Calc (NIH): 72 mg/dL (ref 0–99)
Triglycerides: 215 mg/dL — ABNORMAL HIGH (ref 0–149)
VLDL Cholesterol Cal: 36 mg/dL (ref 5–40)

## 2021-03-13 LAB — TSH: TSH: 0.573 u[IU]/mL (ref 0.450–4.500)

## 2021-03-24 ENCOUNTER — Telehealth: Payer: Self-pay

## 2021-03-24 ENCOUNTER — Other Ambulatory Visit: Payer: Self-pay | Admitting: Family Medicine

## 2021-03-24 DIAGNOSIS — I10 Essential (primary) hypertension: Secondary | ICD-10-CM

## 2021-03-24 MED ORDER — BISOPROLOL-HYDROCHLOROTHIAZIDE 5-6.25 MG PO TABS: 1.0000 | ORAL_TABLET | Freq: Every day | ORAL | 0 refills | Status: DC

## 2021-03-24 NOTE — Telephone Encounter (Signed)
Please advise? Okay to send 30 day to local pharmacy?

## 2021-03-24 NOTE — Telephone Encounter (Signed)
Patient hasnt received the bisoprolol-hydrochlorothiazide 5-6.25 from [facility] and has 2 pills left. Can you please call her in 30 pills to [facility] to hold her over?

## 2021-03-25 ENCOUNTER — Encounter: Payer: Self-pay | Admitting: Family Medicine

## 2021-03-25 ENCOUNTER — Other Ambulatory Visit: Payer: Self-pay

## 2021-03-25 ENCOUNTER — Ambulatory Visit (INDEPENDENT_AMBULATORY_CARE_PROVIDER_SITE_OTHER): Payer: Medicare Other | Admitting: Family Medicine

## 2021-03-25 VITALS — BP 116/81 | HR 79 | Temp 97.3°F | Ht 64.0 in | Wt 200.7 lb

## 2021-03-25 DIAGNOSIS — H6123 Impacted cerumen, bilateral: Secondary | ICD-10-CM | POA: Diagnosis not present

## 2021-03-25 NOTE — Progress Notes (Signed)
° °  SUBJECTIVE:   CHIEF COMPLAINT / HPI:   EAR FULLNESS Duration:  1-2 weeks Involved ear(s): left Quality:  no pain Fever: no Otorrhea: no Upper respiratory infection symptoms:  a little sinus drainage Pruritus:  resolved Hearing loss: yes Water immersion no Using Q-tips: yes Recurrent otitis media: no Treatments attempted: none   OBJECTIVE:   BP 116/81 (BP Location: Left Arm, Patient Position: Sitting, Cuff Size: Normal)    Pulse 79    Temp (!) 97.3 F (36.3 C) (Temporal)    Ht 5\' 4"  (1.626 m)    Wt 200 lb 11.2 oz (91 kg)    SpO2 97%    BMI 34.45 kg/m   Gen: well appearing, in NAD HEENT: impacted cerumen in canals bilaterally.    ASSESSMENT/PLAN:   Impacted cerumen Resolved s/p irrigation with restoration of hearing. Did not want to wait for f/u exam following irrigation therefore unable to view TM following irrigation. Recommend prn debrox.   Myles Gip, DO

## 2021-03-26 ENCOUNTER — Ambulatory Visit: Payer: Medicare Other | Admitting: Family Medicine

## 2021-04-09 DIAGNOSIS — M62830 Muscle spasm of back: Secondary | ICD-10-CM | POA: Diagnosis not present

## 2021-04-09 DIAGNOSIS — M9902 Segmental and somatic dysfunction of thoracic region: Secondary | ICD-10-CM | POA: Diagnosis not present

## 2021-04-09 DIAGNOSIS — M9901 Segmental and somatic dysfunction of cervical region: Secondary | ICD-10-CM | POA: Diagnosis not present

## 2021-04-09 DIAGNOSIS — M5033 Other cervical disc degeneration, cervicothoracic region: Secondary | ICD-10-CM | POA: Diagnosis not present

## 2021-04-14 DIAGNOSIS — M5033 Other cervical disc degeneration, cervicothoracic region: Secondary | ICD-10-CM | POA: Diagnosis not present

## 2021-04-14 DIAGNOSIS — M9902 Segmental and somatic dysfunction of thoracic region: Secondary | ICD-10-CM | POA: Diagnosis not present

## 2021-04-14 DIAGNOSIS — M62830 Muscle spasm of back: Secondary | ICD-10-CM | POA: Diagnosis not present

## 2021-04-14 DIAGNOSIS — M9901 Segmental and somatic dysfunction of cervical region: Secondary | ICD-10-CM | POA: Diagnosis not present

## 2021-07-29 DIAGNOSIS — M9901 Segmental and somatic dysfunction of cervical region: Secondary | ICD-10-CM | POA: Diagnosis not present

## 2021-07-29 DIAGNOSIS — M62830 Muscle spasm of back: Secondary | ICD-10-CM | POA: Diagnosis not present

## 2021-07-29 DIAGNOSIS — M9902 Segmental and somatic dysfunction of thoracic region: Secondary | ICD-10-CM | POA: Diagnosis not present

## 2021-07-29 DIAGNOSIS — M5033 Other cervical disc degeneration, cervicothoracic region: Secondary | ICD-10-CM | POA: Diagnosis not present

## 2021-08-05 DIAGNOSIS — M9902 Segmental and somatic dysfunction of thoracic region: Secondary | ICD-10-CM | POA: Diagnosis not present

## 2021-08-05 DIAGNOSIS — M62830 Muscle spasm of back: Secondary | ICD-10-CM | POA: Diagnosis not present

## 2021-08-05 DIAGNOSIS — M5033 Other cervical disc degeneration, cervicothoracic region: Secondary | ICD-10-CM | POA: Diagnosis not present

## 2021-08-05 DIAGNOSIS — M9901 Segmental and somatic dysfunction of cervical region: Secondary | ICD-10-CM | POA: Diagnosis not present

## 2021-09-08 ENCOUNTER — Ambulatory Visit (INDEPENDENT_AMBULATORY_CARE_PROVIDER_SITE_OTHER): Payer: Medicare Other | Admitting: Family Medicine

## 2021-09-08 ENCOUNTER — Encounter: Payer: Self-pay | Admitting: Family Medicine

## 2021-09-08 VITALS — BP 114/71 | HR 79 | Resp 16 | Ht 64.0 in | Wt 196.0 lb

## 2021-09-08 DIAGNOSIS — E039 Hypothyroidism, unspecified: Secondary | ICD-10-CM | POA: Diagnosis not present

## 2021-09-08 DIAGNOSIS — E7800 Pure hypercholesterolemia, unspecified: Secondary | ICD-10-CM | POA: Diagnosis not present

## 2021-09-08 DIAGNOSIS — Z0000 Encounter for general adult medical examination without abnormal findings: Secondary | ICD-10-CM

## 2021-09-08 DIAGNOSIS — F324 Major depressive disorder, single episode, in partial remission: Secondary | ICD-10-CM

## 2021-09-08 DIAGNOSIS — R7303 Prediabetes: Secondary | ICD-10-CM | POA: Diagnosis not present

## 2021-09-08 DIAGNOSIS — I10 Essential (primary) hypertension: Secondary | ICD-10-CM | POA: Diagnosis not present

## 2021-09-08 MED ORDER — BISOPROLOL-HYDROCHLOROTHIAZIDE 5-6.25 MG PO TABS: 1.0000 | ORAL_TABLET | Freq: Every day | ORAL | 0 refills | Status: DC

## 2021-09-08 MED ORDER — LEVOTHYROXINE SODIUM 150 MCG PO TABS: 150.0000 ug | ORAL_TABLET | Freq: Every day | ORAL | 3 refills | Status: AC

## 2021-09-08 MED ORDER — GLYCOPYRROLATE 1 MG PO TABS: 1.0000 mg | ORAL_TABLET | Freq: Two times a day (BID) | ORAL | 3 refills | Status: DC

## 2021-09-08 MED ORDER — ROSUVASTATIN CALCIUM 10 MG PO TABS: 10.0000 mg | ORAL_TABLET | Freq: Every day | ORAL | 3 refills | Status: AC

## 2021-09-08 MED ORDER — MIRTAZAPINE 30 MG PO TABS: 30.0000 mg | ORAL_TABLET | Freq: Every day | ORAL | 3 refills | Status: AC

## 2021-09-08 MED ORDER — VENLAFAXINE HCL ER 150 MG PO CP24: 150.0000 mg | ORAL_CAPSULE | Freq: Every day | ORAL | 3 refills | Status: AC

## 2021-09-08 NOTE — Progress Notes (Unsigned)
Annual Wellness Visit  I,Jasmine Rich,acting as a scribe for [name], MD.,have documented all relevant documentation on the behalf of [name], MD,as directed by  [name], MD while in the presence of [name], MD.     Patient: Jasmine Rich, Female    DOB: 05-03-1945, 76 y.o.   MRN: [id number] Visit Date: 09/08/2021  Today's Provider: [name], MD   Chief Complaint  Patient presents with   Medicare Wellness   Subjective    Jasmine Rich is a 76 y.o. female who presents today for her Annual Wellness Visit. She reports consuming a general diet. Home exercise routine includes walking. She generally feels well. She reports sleeping fairly well. She does not have additional problems to discuss today.   HPI She is followed by dermatology Dr. Darrick Huntsman. She does have chronic sweats she which she has had for years which seem to be worse recently. She did have a colonoscopy and EGD 3 years ago by Dr. Vira Agar in an outpatient setting at [facility] which is not available to [place].    Medications: Outpatient Medications Prior to Visit  Medication Sig   albuterol (VENTOLIN HFA) 108 (90 Base) MCG/ACT inhaler Inhale 2 puffs into the lungs every 6 (six) hours as needed for wheezing or shortness of breath.   ferrous sulfate 325 (65 FE) MG tablet Take by mouth as needed. Reported on 03/06/2015   meloxicam (MOBIC) 7.5 MG tablet Take 1 tablet (7.5 mg total) by mouth daily.   Multiple Vitamin (MULTIVITAMIN) tablet Take 1 tablet by mouth daily. occasionally    omeprazole (PRILOSEC) 40 MG capsule Take 1 capsule (40 mg total) by mouth daily.   TURMERIC PO Take 500 mg by mouth daily. Unsure dose   valACYclovir (VALTREX) 1000 MG tablet SMARTSIG:1 Tablet(s) By Mouth Every 12 Hours   [DISCONTINUED] bisoprolol-hydrochlorothiazide (ZIAC) 5-6.25 MG tablet Take 1 tablet by mouth daily.   [DISCONTINUED] levothyroxine (SYNTHROID) 150 MCG tablet Take 1 tablet  (150 mcg total) by mouth daily.   [DISCONTINUED] mirtazapine (REMERON) 30 MG tablet Take 1 tablet (30 mg total) by mouth at bedtime.   [DISCONTINUED] rosuvastatin (CRESTOR) 10 MG tablet Take 1 tablet (10 mg total) by mouth daily.   [DISCONTINUED] venlafaxine XR (EFFEXOR-XR) 150 MG 24 hr capsule Take 1 capsule (150 mg total) by mouth daily with breakfast.   No facility-administered medications prior to visit.    Allergies  Allergen Reactions   Codeine Other (See Comments)    Abdominal pain and nausea    Patient Care Team: Jerrol Banana., MD as PCP - General (Family Medicine) Brita Romp, Dionne Bucy, MD as Consulting Physician (Family Medicine) Ree Edman, MD (Dermatology) [facility])  Review of Systems  All other systems reviewed and are negative.   Last hemoglobin A1c Lab Results  Component Value Date   HGBA1C 6.4 (H) 03/12/2021        Objective    Vitals: BP 114/71 (BP Location: Left Arm, Cuff Size: Large)   Pulse 79   Resp 16   Ht '5\' 4"'$  (1.626 m)   Wt 196 lb (88.9 kg)   SpO2 97%   BMI 33.64 kg/m  BP Readings from Last 3 Encounters:  09/08/21 114/71  03/25/21 116/81  03/11/21 112/77   Wt Readings from Last 3 Encounters:  09/08/21 196 lb (88.9 kg)  03/25/21 200 lb 11.2 oz (91 kg)  03/11/21 201 lb (91.2 kg)  Physical Exam Constitutional:      Appearance: Normal appearance. She is obese.  HENT:     Head: Normocephalic and atraumatic.     Right Ear: Tympanic membrane, ear canal and external ear normal.     Left Ear: Tympanic membrane, ear canal and external ear normal.     Nose: Nose normal.     Mouth/Throat:     Mouth: Mucous membranes are moist.     Pharynx: Oropharynx is clear.  Eyes:     Extraocular Movements: Extraocular movements intact.     Conjunctiva/sclera: Conjunctivae normal.     Pupils: Pupils are equal, round, and reactive to light.  Cardiovascular:     Rate and Rhythm: Normal rate and regular  rhythm.     Pulses: Normal pulses.     Heart sounds: Normal heart sounds.  Pulmonary:     Effort: Pulmonary effort is normal.     Breath sounds: Normal breath sounds.  Abdominal:     General: Bowel sounds are normal.     Palpations: Abdomen is soft.  Musculoskeletal:     Cervical back: Neck supple.  Skin:    General: Skin is warm and dry.     Comments: Fair skin.  Neurological:     General: No focal deficit present.     Mental Status: She is alert and oriented to person, place, and time. Mental status is at baseline.  Psychiatric:        Mood and Affect: Mood normal.        Behavior: Behavior normal.        Thought Content: Thought content normal.        Judgment: Judgment normal.      Most recent functional status assessment:    03/25/2021    9:36 AM  In your present state of health, do you have any difficulty performing the following activities:  Hearing? 1  Vision? 0  Difficulty concentrating or making decisions? 0  Walking or climbing stairs? 0  Dressing or bathing? 0  Doing errands, shopping? 0   Most recent fall risk assessment:    03/25/2021    9:35 AM  Fall Risk   Falls in the past year? 0  Number falls in past yr: 0  Injury with Fall? 0  Risk for fall due to : No Fall Risks  Follow up Falls evaluation completed    Most recent depression screenings:    03/25/2021    9:35 AM 03/11/2021    1:50 PM  PHQ 2/9 Scores  PHQ - 2 Score 0 0  PHQ- 9 Score 0 0   Most recent cognitive screening:     No data to display         Most recent Audit-C alcohol use screening    03/25/2021    9:34 AM  Alcohol Use Disorder Test (AUDIT)  1. How often do you have a drink containing alcohol? 0  2. How many drinks containing alcohol do you have on a typical day when you are drinking? 0  3. How often do you have six or more drinks on one occasion? 3  AUDIT-C Score 3  4. How often during the last year have you found that you were not able to stop drinking once you had  started? 0  5. How often during the last year have you failed to do what was normally expected from you because of drinking? 0  6. How often during the last year have you needed a first  drink in the morning to get yourself going after a heavy drinking session? 0  7. How often during the last year have you had a feeling of guilt of remorse after drinking? 0  8. How often during the last year have you been unable to remember what happened the night before because you had been drinking? 0  9. Have you or someone else been injured as a result of your drinking? 0  10. Has a relative or friend or a doctor or another health worker been concerned about your drinking or suggested you cut down? 0  Alcohol Use Disorder Identification Test Final Score (AUDIT) 3   A score of 3 or more in women, and 4 or more in men indicates increased risk for alcohol abuse, EXCEPT if all of the points are from question 1   No results found for any visits on 09/08/21.  Assessment & Plan     Annual wellness visit done today including the all of the following: Reviewed patient's Family Medical History Reviewed and updated list of patient's medical providers Assessment of cognitive impairment was done Assessed patient's functional ability Established a written schedule for health screening [place] Completed and Reviewed  Exercise Activities and Dietary recommendations  Goals      DIET - INCREASE WATER INTAKE     Recommend to cut back on caffeinated drinks and increase water intake to 4-6 glasses a day.         Immunization History  Administered Date(s) Administered   Influenza, High Dose Seasonal PF 12/14/2016, 11/17/2017, 12/17/2020   Influenza-Unspecified 11/30/2012, 12/17/2014   PFIZER(Purple Top)SARS-COV-2 Vaccination 04/14/2019, 05/09/2019   Pneumococcal Conjugate-13 08/13/2017   Pneumococcal Polysaccharide-23 10/27/2010   Tdap 10/27/2010    Health Maintenance  Topic Date Due    Zoster Vaccines- Shingrix (1 of 2) Never done   COVID-19 Vaccine (3 - Pfizer series) 07/04/2019   DEXA SCAN  09/26/2020   TETANUS/TDAP  10/26/2020   INFLUENZA VACCINE  09/30/2021   Pneumonia Vaccine 87+ Years old  Completed   Hepatitis C Screening  Completed   HPV VACCINES  Aged Out   COLONOSCOPY (Pts 45-20yr Insurance coverage will need to be confirmed)  Discontinued     Discussed health benefits of physical activity, and encouraged her to engage in regular exercise appropriate for her age and condition.    1. Major depressive disorder with single episode, in partial remission (HCC)  - venlafaxine XR (EFFEXOR-XR) 150 MG 24 hr capsule; Take 1 capsule (150 mg total) by mouth daily with breakfast.  Dispense: 90 capsule; Refill: 3 - mirtazapine (REMERON) 30 MG tablet; Take 1 tablet (30 mg total) by mouth at bedtime.  Dispense: 90 tablet; Refill: 3  2. Hypercholesteremia  - rosuvastatin (CRESTOR) 10 MG tablet; Take 1 tablet (10 mg total) by mouth daily.  Dispense: 90 tablet; Refill: 3  3. Essential (primary) hypertension  - bisoprolol-hydrochlorothiazide (ZIAC) 5-6.25 MG tablet; Take 1 tablet by mouth daily.  Dispense: 30 tablet; Refill: 0  4. Encounter for Medicare annual wellness exam   5. Prediabetes   6. Acquired hypothyroidism  - levothyroxine (SYNTHROID) 150 MCG tablet; Take 1 tablet (150 mcg total) by mouth daily.  Dispense: 90 tablet; Refill: 3 7.  Excessive sweating Low-dose glycopyrrolate daily and follow-up in 2-3 months.  Return in about 3 months (around 12/09/2021).     I, RWilhemena Durie MD, have reviewed all documentation for this visit. The documentation on 09/10/21 for the exam, diagnosis, procedures, and  orders are all accurate and complete.    Stacey Sago Cranford Mon, MD  [facility] [phone] (phone) [phone] (fax)  [place]

## 2021-09-11 ENCOUNTER — Encounter: Payer: Self-pay | Admitting: *Deleted

## 2021-09-11 ENCOUNTER — Other Ambulatory Visit: Payer: Self-pay | Admitting: Family Medicine

## 2021-09-11 DIAGNOSIS — Z1231 Encounter for screening mammogram for malignant neoplasm of breast: Secondary | ICD-10-CM

## 2021-10-02 ENCOUNTER — Ambulatory Visit
Admission: RE | Admit: 2021-10-02 | Discharge: 2021-10-02 | Disposition: A | Discharge status: Home / Self Care | Payer: Medicare Other | Source: Ambulatory Visit | Attending: Family Medicine | Admitting: Family Medicine

## 2021-10-02 DIAGNOSIS — Z1231 Encounter for screening mammogram for malignant neoplasm of breast: Secondary | ICD-10-CM | POA: Diagnosis not present

## 2021-10-03 DIAGNOSIS — D2262 Melanocytic nevi of left upper limb, including shoulder: Secondary | ICD-10-CM | POA: Diagnosis not present

## 2021-10-03 DIAGNOSIS — D485 Neoplasm of uncertain behavior of skin: Secondary | ICD-10-CM | POA: Diagnosis not present

## 2021-10-03 DIAGNOSIS — L821 Other seborrheic keratosis: Secondary | ICD-10-CM | POA: Diagnosis not present

## 2021-10-03 DIAGNOSIS — D2272 Melanocytic nevi of left lower limb, including hip: Secondary | ICD-10-CM | POA: Diagnosis not present

## 2021-10-03 DIAGNOSIS — D235 Other benign neoplasm of skin of trunk: Secondary | ICD-10-CM | POA: Diagnosis not present

## 2021-10-03 DIAGNOSIS — D2271 Melanocytic nevi of right lower limb, including hip: Secondary | ICD-10-CM | POA: Diagnosis not present

## 2021-10-03 DIAGNOSIS — D225 Melanocytic nevi of trunk: Secondary | ICD-10-CM | POA: Diagnosis not present

## 2021-10-03 DIAGNOSIS — D2261 Melanocytic nevi of right upper limb, including shoulder: Secondary | ICD-10-CM | POA: Diagnosis not present

## 2021-10-03 DIAGNOSIS — L814 Other melanin hyperpigmentation: Secondary | ICD-10-CM | POA: Diagnosis not present

## 2021-11-12 ENCOUNTER — Other Ambulatory Visit: Payer: Self-pay | Admitting: Family Medicine

## 2021-11-12 DIAGNOSIS — I10 Essential (primary) hypertension: Secondary | ICD-10-CM

## 2021-11-12 MED ORDER — BISOPROLOL-HYDROCHLOROTHIAZIDE 5-6.25 MG PO TABS: 1.0000 | ORAL_TABLET | Freq: Every day | ORAL | 0 refills | Status: DC

## 2021-12-17 ENCOUNTER — Encounter: Payer: Self-pay | Admitting: Physician Assistant

## 2021-12-17 ENCOUNTER — Ambulatory Visit: Payer: Medicare Other | Admitting: Family Medicine

## 2021-12-17 ENCOUNTER — Ambulatory Visit (INDEPENDENT_AMBULATORY_CARE_PROVIDER_SITE_OTHER): Payer: Medicare Other | Admitting: Physician Assistant

## 2021-12-17 VITALS — BP 107/66 | HR 90 | Ht 64.0 in | Wt 200.0 lb

## 2021-12-17 DIAGNOSIS — J4520 Mild intermittent asthma, uncomplicated: Secondary | ICD-10-CM

## 2021-12-17 DIAGNOSIS — R61 Generalized hyperhidrosis: Secondary | ICD-10-CM

## 2021-12-17 DIAGNOSIS — I10 Essential (primary) hypertension: Secondary | ICD-10-CM | POA: Diagnosis not present

## 2021-12-17 DIAGNOSIS — Z23 Encounter for immunization: Secondary | ICD-10-CM

## 2021-12-17 MED ORDER — BISOPROLOL-HYDROCHLOROTHIAZIDE 5-6.25 MG PO TABS: 1.0000 | ORAL_TABLET | Freq: Every day | ORAL | 3 refills | Status: DC

## 2021-12-17 MED ORDER — ALBUTEROL SULFATE HFA 108 (90 BASE) MCG/ACT IN AERS: 2.0000 | INHALATION_SPRAY | Freq: Four times a day (QID) | RESPIRATORY_TRACT | 2 refills | Status: AC | PRN

## 2021-12-17 NOTE — Progress Notes (Unsigned)
I,Sha'taria Tyson,acting as a [employer] administrator for [employer], PA-C.,have documented all relevant documentation on the behalf of Mikey Kirschner, PA-C,as directed by  Mikey Kirschner, PA-C while in the presence of Mikey Kirschner, PA-C.   Established patient visit   Patient: Jasmine Rich   DOB: Dec 18, 1945   76 y.o. Female  MRN: [id number] Visit Date: 12/17/2021  Today's healthcare provider: Mikey Kirschner, PA-C   No chief complaint on file.  Subjective    HPI  Hypertension, follow-up  BP Readings from Last 3 Encounters:  09/08/21 114/71  03/25/21 116/81  03/11/21 112/77   Wt Readings from Last 3 Encounters:  09/08/21 196 lb (88.9 kg)  03/25/21 200 lb 11.2 oz (91 kg)  03/11/21 201 lb (91.2 kg)     She was last seen for hypertension 3 months ago.  BP at that visit was 114/71. Management since that visit includes continue current treatment.  She reports excellent compliance with treatment. She is not having side effects. {document side effects if present:1} She is following a Low Sodium diet. She is not exercising. She does not smoke.  Use of agents associated with hypertension:  caffeine .   Outside blood pressures are {***enter patient reported home BP readings, or 'not being checked':1}. Symptoms: No chest pain No chest pressure  Yes palpitations No syncope  No dyspnea Yes orthopnea  No paroxysmal nocturnal dyspnea No lower extremity edema   Pertinent labs Lab Results  Component Value Date   CHOL 137 03/12/2021   HDL 29 (L) 03/12/2021   LDLCALC 72 03/12/2021   TRIG 215 (H) 03/12/2021   CHOLHDL 4.7 (H) 03/12/2021   Lab Results  Component Value Date   NA 139 03/12/2021   K 4.8 03/12/2021   CREATININE 1.10 (H) 03/12/2021   EGFR 52 (L) 03/12/2021   GLUCOSE 124 (H) 03/12/2021   TSH 0.573 03/12/2021     The 10-year ASCVD risk score (Arnett DK, et al., 2019) is:  17.9%  ---------------------------------------------------------------------------------------------------  Follow up for excessive sweating  The patient was last seen for this 3 months ago. Changes made at last visit include Low-dose glycopyrrolate daily and follow-up in 2-3 months..  She reports fair compliance with treatment. She feels that condition is Worse. She is having side effects. palpitations  -----------------------------------------------------------------------------------------   Medications: Outpatient Medications Prior to Visit  Medication Sig   albuterol (VENTOLIN HFA) 108 (90 Base) MCG/ACT inhaler Inhale 2 puffs into the lungs every 6 (six) hours as needed for wheezing or shortness of breath.   bisoprolol-hydrochlorothiazide (ZIAC) 5-6.25 MG tablet Take 1 tablet by mouth daily.   ferrous sulfate 325 (65 FE) MG tablet Take by mouth as needed. Reported on 03/06/2015   glycopyrrolate (ROBINUL) 1 MG tablet Take 1 tablet (1 mg total) by mouth 2 (two) times daily.   levothyroxine (SYNTHROID) 150 MCG tablet Take 1 tablet (150 mcg total) by mouth daily.   meloxicam (MOBIC) 7.5 MG tablet Take 1 tablet (7.5 mg total) by mouth daily.   mirtazapine (REMERON) 30 MG tablet Take 1 tablet (30 mg total) by mouth at bedtime.   Multiple Vitamin (MULTIVITAMIN) tablet Take 1 tablet by mouth daily. occasionally    omeprazole (PRILOSEC) 40 MG capsule Take 1 capsule (40 mg total) by mouth daily.   rosuvastatin (CRESTOR) 10 MG tablet Take 1 tablet (10 mg total) by mouth daily.   TURMERIC PO Take 500 mg by mouth daily. Unsure dose   valACYclovir (VALTREX) 1000 MG tablet SMARTSIG:1 Tablet(s) By Mouth Every  12 Hours   venlafaxine XR (EFFEXOR-XR) 150 MG 24 hr capsule Take 1 capsule (150 mg total) by mouth daily with breakfast.   No facility-administered medications prior to visit.    Review of Systems  {Labs  Heme  Chem  Endocrine  Serology  Results Review (optional):23779}   Objective     There were no vitals taken for this visit. {Show previous vital signs (optional):23777}  Physical Exam  ***  No results found for any visits on 12/17/21.  Assessment & Plan     ***  No follow-ups on file.      {provider attestation***:1}   Mikey Kirschner, PA-C  [facility] [phone] (phone) [phone] (fax)  [place]

## 2021-12-18 ENCOUNTER — Encounter: Payer: Self-pay | Admitting: Physician Assistant

## 2021-12-18 ENCOUNTER — Other Ambulatory Visit: Payer: Self-pay | Admitting: Physician Assistant

## 2021-12-18 DIAGNOSIS — R61 Generalized hyperhidrosis: Secondary | ICD-10-CM | POA: Insufficient documentation

## 2021-12-18 DIAGNOSIS — E039 Hypothyroidism, unspecified: Secondary | ICD-10-CM

## 2021-12-18 DIAGNOSIS — J4520 Mild intermittent asthma, uncomplicated: Secondary | ICD-10-CM | POA: Insufficient documentation

## 2021-12-18 LAB — CBC WITH DIFFERENTIAL/PLATELET
Basophils Absolute: 0 10*3/uL (ref 0.0–0.2)
Basos: 0 %
EOS (ABSOLUTE): 0 10*3/uL (ref 0.0–0.4)
Eos: 0 %
Hematocrit: 41.7 % (ref 34.0–46.6)
Hemoglobin: 13.5 g/dL (ref 11.1–15.9)
Immature Grans (Abs): 0 10*3/uL (ref 0.0–0.1)
Immature Granulocytes: 0 %
Lymphocytes Absolute: 2.5 10*3/uL (ref 0.7–3.1)
Lymphs: 27 %
MCH: 28.3 pg (ref 26.6–33.0)
MCHC: 32.4 g/dL (ref 31.5–35.7)
MCV: 87 fL (ref 79–97)
Monocytes Absolute: 1.2 10*3/uL — ABNORMAL HIGH (ref 0.1–0.9)
Monocytes: 13 %
Neutrophils Absolute: 5.6 10*3/uL (ref 1.4–7.0)
Neutrophils: 60 %
Platelets: 358 10*3/uL (ref 150–450)
RBC: 4.77 x10E6/uL (ref 3.77–5.28)
RDW: 13.2 % (ref 11.7–15.4)
WBC: 9.4 10*3/uL (ref 3.4–10.8)

## 2021-12-18 LAB — COMPREHENSIVE METABOLIC PANEL
ALT: 10 IU/L (ref 0–32)
AST: 20 IU/L (ref 0–40)
Albumin/Globulin Ratio: 2.3 — ABNORMAL HIGH (ref 1.2–2.2)
Albumin: 4.6 g/dL (ref 3.8–4.8)
Alkaline Phosphatase: 110 IU/L (ref 44–121)
BUN/Creatinine Ratio: 15 (ref 12–28)
BUN: 16 mg/dL (ref 8–27)
Bilirubin Total: 0.3 mg/dL (ref 0.0–1.2)
CO2: 24 mmol/L (ref 20–29)
Calcium: 10 mg/dL (ref 8.7–10.3)
Chloride: 99 mmol/L (ref 96–106)
Creatinine, Ser: 1.1 mg/dL — ABNORMAL HIGH (ref 0.57–1.00)
Globulin, Total: 2 g/dL (ref 1.5–4.5)
Glucose: 128 mg/dL — ABNORMAL HIGH (ref 70–99)
Potassium: 4.6 mmol/L (ref 3.5–5.2)
Sodium: 139 mmol/L (ref 134–144)
Total Protein: 6.6 g/dL (ref 6.0–8.5)
eGFR: 52 mL/min/{1.73_m2} — ABNORMAL LOW (ref >59)

## 2021-12-18 LAB — TSH+FREE T4
Free T4: 1.63 ng/dL (ref 0.82–1.77)
TSH: 0.211 u[IU]/mL — ABNORMAL LOW (ref 0.450–4.500)

## 2021-12-18 LAB — C-REACTIVE PROTEIN: CRP: 2 mg/L (ref 0–10)

## 2021-12-18 NOTE — Assessment & Plan Note (Signed)
Chronic; well controlled w/ bisoprolol 5 hctz 6.25 continue current medications Ordered cmp  F/u 79mo

## 2021-12-18 NOTE — Assessment & Plan Note (Signed)
Limited improvement w/ glycopyrrolate and pt having significant side effects Advised doing labs and if all normal referring to endocrinology; h/o hypothyroidism

## 2021-12-18 NOTE — Assessment & Plan Note (Signed)
Well controlled refilled albuterol inhaler for prn use

## 2021-12-29 ENCOUNTER — Other Ambulatory Visit: Payer: Self-pay | Admitting: Family Medicine

## 2021-12-29 DIAGNOSIS — I10 Essential (primary) hypertension: Secondary | ICD-10-CM

## 2021-12-29 NOTE — Telephone Encounter (Signed)
Patient took her last bisoprolol-hydrochlorothiazide (ZIAC) 5-6.25 MG tablet yesterday. Patient would like a short supply sent to [facility] to hold her over until mail order is received. Patient would like request expedited today with a follow up call.    [place], [place] Phone:  [phone]  Fax:  [phone]

## 2021-12-29 NOTE — Telephone Encounter (Signed)
Pt called to check status of request, says she wants this submitted today and for someone in the clinic to call her with an update.  Best contact: [phone]

## 2021-12-30 MED ORDER — BISOPROLOL-HYDROCHLOROTHIAZIDE 5-6.25 MG PO TABS: 1.0000 | ORAL_TABLET | Freq: Every day | ORAL | 0 refills | Status: AC

## 2021-12-30 NOTE — Telephone Encounter (Signed)
Short term supply  Requested Prescriptions  Pending Prescriptions Disp Refills  . bisoprolol-hydrochlorothiazide (ZIAC) 5-6.25 MG tablet 7 tablet 0    Sig: Take 1 tablet by mouth daily.     Cardiovascular: Beta Blocker + Diuretic Combos Failed - 12/29/2021  3:48 PM      Failed - Cr in normal range and within 180 days    Creatinine, Ser  Date Value Ref Range Status  12/17/2021 1.10 (H) 0.57 - 1.00 mg/dL Final         Failed - eGFR in normal range and within 180 days    GFR calc Af Amer  Date Value Ref Range Status  01/30/2020 63 >59 mL/min/1.73 Final    Comment:    **In accordance with recommendations from the NKF-ASN Task force,**   [facility] is in the process of updating its eGFR calculation to the   2021 CKD-EPI creatinine equation that estimates kidney function   without a race variable.    GFR calc non Af Amer  Date Value Ref Range Status  01/30/2020 54 (L) >59 mL/min/1.73 Final   eGFR  Date Value Ref Range Status  12/17/2021 52 (L) >59 mL/min/1.73 Final         Passed - K in normal range and within 180 days    Potassium  Date Value Ref Range Status  12/17/2021 4.6 3.5 - 5.2 mmol/L Final         Passed - Na in normal range and within 180 days    Sodium  Date Value Ref Range Status  12/17/2021 139 134 - 144 mmol/L Final         Passed - Last BP in normal range    BP Readings from Last 1 Encounters:  12/17/21 107/66         Passed - Last Heart Rate in normal range    Pulse Readings from Last 1 Encounters:  12/17/21 90         Passed - Valid encounter within last 6 months    Recent Outpatient Visits          1 week ago Essential (primary) hypertension   [employer], [place], PA-C   3 months ago Encounter for [employer] annual wellness exam   [facility] Jerrol Banana., MD   9 months ago Bilateral impacted cerumen   [facility] Myles Gip, DO   9 months ago Acquired hypothyroidism    [facility] Myles Gip, DO   1 year ago Perimenopausal vasomotor symptoms   [facility] Jerrol Banana., MD      Future Appointments            In 2 months Thedore Mins, Ria Comment, PA-C [facility] [place]

## 2022-01-26 ENCOUNTER — Telehealth: Payer: Self-pay | Admitting: Family Medicine

## 2022-01-26 NOTE — Telephone Encounter (Signed)
Sent message to referrals.

## 2022-01-26 NOTE — Telephone Encounter (Addendum)
Per Parke Poisson. She can send the original referral to a provider here in [place].

## 2022-01-26 NOTE — Telephone Encounter (Signed)
Referral Request - Has patient seen PCP for this complaint? Yes.    Referral for which specialty:  Endocrinology   Preferred provider/office: [place], [place] requesting a endocrinology practice in [place]  Pt requesting a cb to discuss request further

## 2022-01-27 NOTE — Telephone Encounter (Signed)
Pt called in to update ongoing referral. Pt has looked into couple of the endocrinologist @ [facility] and as located just a mile or so from her house would prefer to stay in town, Conservation officer, historic buildings in [place] never called her and she really would rather be seen in [place] and would like to see Dr Lenna Sciara [place] @ [phone]. She would be willing to see the other 2 if Dr Gabriel Carina not available. Pls ask the practice [facility]) to call her for scheduling if possible. [phone]

## 2022-01-28 NOTE — Telephone Encounter (Signed)
Note sent to referral coordinator

## 2022-01-28 NOTE — Telephone Encounter (Signed)
Pt requesting referral sent to [facility]  In [place]  Phone [phone]

## 2022-03-19 ENCOUNTER — Encounter: Payer: Self-pay | Admitting: Physician Assistant

## 2022-03-19 ENCOUNTER — Ambulatory Visit (INDEPENDENT_AMBULATORY_CARE_PROVIDER_SITE_OTHER): Payer: Medicare Other | Admitting: Physician Assistant

## 2022-03-19 VITALS — BP 106/79 | HR 80 | Temp 97.3°F | Ht 64.0 in | Wt 194.0 lb

## 2022-03-19 DIAGNOSIS — R61 Generalized hyperhidrosis: Secondary | ICD-10-CM | POA: Diagnosis not present

## 2022-03-19 DIAGNOSIS — E039 Hypothyroidism, unspecified: Secondary | ICD-10-CM | POA: Diagnosis not present

## 2022-03-19 NOTE — Assessment & Plan Note (Signed)
Referring to endo again --   Limited improvement w/ glycopyrrolate and pt was having significant side effects. Labs only demonstrated slight overcorrection of hypothyroidism -- but pt's symptoms have been present for much longer with normal thyroid labs.

## 2022-03-19 NOTE — Assessment & Plan Note (Signed)
Slight overcorrection on last labs; referring to endocrinology given excessive sweating and hypothyroidism. See visit 12/17/21

## 2022-03-19 NOTE — Progress Notes (Signed)
Established patient visit   Patient: Jasmine Rich   DOB: 1946/02/13   77 y.o. Female  MRN: [id number] Visit Date: 03/19/2022  Today's healthcare provider: Mikey Kirschner, PA-C   Cc. New referral to endo  Subjective    HPI   Pt presents today for follow up on her endocrinology referral. She wants to stay local but does not want to see Dr Alfredo Batty. She needs the referral to [place] in [employer]   No change to her sweating.   Medications: Outpatient Medications Prior to Visit  Medication Sig   albuterol (VENTOLIN HFA) 108 (90 Base) MCG/ACT inhaler Inhale 2 puffs into the lungs every 6 (six) hours as needed for wheezing or shortness of breath.   bisoprolol-hydrochlorothiazide (ZIAC) 5-6.25 MG tablet Take 1 tablet by mouth daily.   ferrous sulfate 325 (65 FE) MG tablet Take by mouth as needed. Reported on 03/06/2015   levothyroxine (SYNTHROID) 150 MCG tablet Take 1 tablet (150 mcg total) by mouth daily.   meloxicam (MOBIC) 7.5 MG tablet Take 1 tablet (7.5 mg total) by mouth daily.   mirtazapine (REMERON) 30 MG tablet Take 1 tablet (30 mg total) by mouth at bedtime.   omeprazole (PRILOSEC) 40 MG capsule Take 1 capsule (40 mg total) by mouth daily.   rosuvastatin (CRESTOR) 10 MG tablet Take 1 tablet (10 mg total) by mouth daily.   TURMERIC PO Take 500 mg by mouth daily. As needed   valACYclovir (VALTREX) 1000 MG tablet SMARTSIG:1 Tablet(s) By Mouth Every 12 Hours   venlafaxine XR (EFFEXOR-XR) 150 MG 24 hr capsule Take 1 capsule (150 mg total) by mouth daily with breakfast.   No facility-administered medications prior to visit.        Objective    Blood pressure 106/79, pulse 80, temperature (!) 97.3 F (36.3 C), height '5\' 4"'$  (1.626 m), weight 194 lb (88 kg), SpO2 99 %.   Physical Exam Vitals reviewed.  Constitutional:      Appearance: She is not ill-appearing.  HENT:     Head: Normocephalic.  Eyes:     Conjunctiva/sclera: Conjunctivae normal.   Cardiovascular:     Rate and Rhythm: Normal rate.  Pulmonary:     Effort: Pulmonary effort is normal. No respiratory distress.  Neurological:     General: No focal deficit present.     Mental Status: She is alert and oriented to person, place, and time.  Psychiatric:        Mood and Affect: Mood normal.        Behavior: Behavior normal.      No results found for any visits on 03/19/22.  Assessment & Plan     Problem List Items Addressed This Visit       Endocrine   Acquired hypothyroidism    Slight overcorrection on last labs; referring to endocrinology given excessive sweating and hypothyroidism. See visit 12/17/21      Relevant Orders   Ambulatory referral to Endocrinology     Other   Excessive sweating - Primary    Referring to endo again --   Limited improvement w/ glycopyrrolate and pt was having significant side effects. Labs only demonstrated slight overcorrection of hypothyroidism -- but pt's symptoms have been present for much longer with normal thyroid labs.       Relevant Orders   Ambulatory referral to Endocrinology    Return in about 6 months (around 09/17/2022) for AVW.      I, Mikey Kirschner, PA-C have  reviewed all documentation for this visit. The documentation on  03/19/22  for the exam, diagnosis, procedures, and orders are all accurate and complete.  Mikey Kirschner, PA-C [facility] [address] [place], [place], [address] Office: [phone] Fax: [place]

## 2022-03-27 ENCOUNTER — Telehealth: Payer: Self-pay

## 2022-03-27 NOTE — Telephone Encounter (Signed)
Copied from [place] [phone]. Topic: Referral - Status >> Mar 27, 2022  4:09 PM Penni Bombard wrote: Reason for CRM: pt called saying [facility] is not taking any new patients until November.   [phone]

## 2022-03-30 NOTE — Telephone Encounter (Signed)
From referral coordinator. I just spoke to her and she said she was going to try to get in to see Dr Rosanna Randy and hopefully because he is now with [facility] that they will take her referral from him.

## 2022-03-30 NOTE — Telephone Encounter (Signed)
Sent message to referral coordinator.

## 2022-04-16 DIAGNOSIS — S92351A Displaced fracture of fifth metatarsal bone, right foot, initial encounter for closed fracture: Secondary | ICD-10-CM | POA: Diagnosis not present

## 2022-04-16 DIAGNOSIS — M79671 Pain in right foot: Secondary | ICD-10-CM | POA: Diagnosis not present

## 2022-04-16 DIAGNOSIS — M25531 Pain in right wrist: Secondary | ICD-10-CM | POA: Diagnosis not present

## 2022-04-16 DIAGNOSIS — M7989 Other specified soft tissue disorders: Secondary | ICD-10-CM | POA: Diagnosis not present

## 2022-04-16 DIAGNOSIS — M13831 Other specified arthritis, right wrist: Secondary | ICD-10-CM | POA: Diagnosis not present

## 2022-04-23 DIAGNOSIS — S92351A Displaced fracture of fifth metatarsal bone, right foot, initial encounter for closed fracture: Secondary | ICD-10-CM | POA: Diagnosis not present

## 2022-05-15 DIAGNOSIS — S93401A Sprain of unspecified ligament of right ankle, initial encounter: Secondary | ICD-10-CM | POA: Diagnosis not present

## 2022-05-15 DIAGNOSIS — S92351A Displaced fracture of fifth metatarsal bone, right foot, initial encounter for closed fracture: Secondary | ICD-10-CM | POA: Diagnosis not present

## 2022-06-10 DIAGNOSIS — F324 Major depressive disorder, single episode, in partial remission: Secondary | ICD-10-CM | POA: Diagnosis not present

## 2022-06-10 DIAGNOSIS — R490 Dysphonia: Secondary | ICD-10-CM | POA: Diagnosis not present

## 2022-06-10 DIAGNOSIS — E039 Hypothyroidism, unspecified: Secondary | ICD-10-CM | POA: Diagnosis not present

## 2022-06-10 DIAGNOSIS — R61 Generalized hyperhidrosis: Secondary | ICD-10-CM | POA: Diagnosis not present

## 2022-06-10 DIAGNOSIS — R739 Hyperglycemia, unspecified: Secondary | ICD-10-CM | POA: Diagnosis not present

## 2022-06-10 DIAGNOSIS — I10 Essential (primary) hypertension: Secondary | ICD-10-CM | POA: Diagnosis not present

## 2022-06-10 DIAGNOSIS — K219 Gastro-esophageal reflux disease without esophagitis: Secondary | ICD-10-CM | POA: Diagnosis not present

## 2022-06-10 DIAGNOSIS — E7800 Pure hypercholesterolemia, unspecified: Secondary | ICD-10-CM | POA: Diagnosis not present

## 2022-06-24 DIAGNOSIS — M9905 Segmental and somatic dysfunction of pelvic region: Secondary | ICD-10-CM | POA: Diagnosis not present

## 2022-06-24 DIAGNOSIS — M5416 Radiculopathy, lumbar region: Secondary | ICD-10-CM | POA: Diagnosis not present

## 2022-06-24 DIAGNOSIS — M9902 Segmental and somatic dysfunction of thoracic region: Secondary | ICD-10-CM | POA: Diagnosis not present

## 2022-06-24 DIAGNOSIS — M62830 Muscle spasm of back: Secondary | ICD-10-CM | POA: Diagnosis not present

## 2022-07-15 DIAGNOSIS — R61 Generalized hyperhidrosis: Secondary | ICD-10-CM | POA: Diagnosis not present

## 2022-07-15 DIAGNOSIS — F419 Anxiety disorder, unspecified: Secondary | ICD-10-CM | POA: Diagnosis not present

## 2022-07-15 DIAGNOSIS — F324 Major depressive disorder, single episode, in partial remission: Secondary | ICD-10-CM | POA: Diagnosis not present

## 2022-07-15 DIAGNOSIS — E039 Hypothyroidism, unspecified: Secondary | ICD-10-CM | POA: Diagnosis not present

## 2022-07-15 DIAGNOSIS — I10 Essential (primary) hypertension: Secondary | ICD-10-CM | POA: Diagnosis not present

## 2022-09-10 ENCOUNTER — Encounter: Payer: Medicare Other | Admitting: Family Medicine

## 2022-09-17 ENCOUNTER — Ambulatory Visit: Payer: Medicare Other | Admitting: Physician Assistant

## 2022-09-18 DIAGNOSIS — E039 Hypothyroidism, unspecified: Secondary | ICD-10-CM | POA: Diagnosis not present

## 2022-09-18 DIAGNOSIS — F419 Anxiety disorder, unspecified: Secondary | ICD-10-CM | POA: Diagnosis not present

## 2022-09-18 DIAGNOSIS — E119 Type 2 diabetes mellitus without complications: Secondary | ICD-10-CM | POA: Diagnosis not present

## 2022-09-18 DIAGNOSIS — E6609 Other obesity due to excess calories: Secondary | ICD-10-CM | POA: Diagnosis not present

## 2022-09-18 DIAGNOSIS — I10 Essential (primary) hypertension: Secondary | ICD-10-CM | POA: Diagnosis not present

## 2022-09-18 DIAGNOSIS — R61 Generalized hyperhidrosis: Secondary | ICD-10-CM | POA: Diagnosis not present

## 2022-09-18 DIAGNOSIS — Z6833 Body mass index (BMI) 33.0-33.9, adult: Secondary | ICD-10-CM | POA: Diagnosis not present

## 2022-10-14 DIAGNOSIS — D2271 Melanocytic nevi of right lower limb, including hip: Secondary | ICD-10-CM | POA: Diagnosis not present

## 2022-10-14 DIAGNOSIS — L821 Other seborrheic keratosis: Secondary | ICD-10-CM | POA: Diagnosis not present

## 2022-10-14 DIAGNOSIS — D2262 Melanocytic nevi of left upper limb, including shoulder: Secondary | ICD-10-CM | POA: Diagnosis not present

## 2022-10-14 DIAGNOSIS — L570 Actinic keratosis: Secondary | ICD-10-CM | POA: Diagnosis not present

## 2022-10-14 DIAGNOSIS — D2272 Melanocytic nevi of left lower limb, including hip: Secondary | ICD-10-CM | POA: Diagnosis not present

## 2022-10-14 DIAGNOSIS — D2261 Melanocytic nevi of right upper limb, including shoulder: Secondary | ICD-10-CM | POA: Diagnosis not present

## 2022-10-14 DIAGNOSIS — D225 Melanocytic nevi of trunk: Secondary | ICD-10-CM | POA: Diagnosis not present

## 2022-11-09 ENCOUNTER — Other Ambulatory Visit: Payer: Self-pay | Admitting: Family Medicine

## 2022-11-09 DIAGNOSIS — Z1231 Encounter for screening mammogram for malignant neoplasm of breast: Secondary | ICD-10-CM

## 2022-11-12 ENCOUNTER — Ambulatory Visit
Admission: RE | Admit: 2022-11-12 | Discharge: 2022-11-12 | Disposition: A | Discharge status: Home / Self Care | Payer: Medicare Other | Source: Ambulatory Visit | Attending: Family Medicine | Admitting: Family Medicine

## 2022-11-12 DIAGNOSIS — Z1231 Encounter for screening mammogram for malignant neoplasm of breast: Secondary | ICD-10-CM | POA: Diagnosis present

## 2022-12-24 DIAGNOSIS — Z87891 Personal history of nicotine dependence: Secondary | ICD-10-CM | POA: Diagnosis not present

## 2022-12-24 DIAGNOSIS — E6609 Other obesity due to excess calories: Secondary | ICD-10-CM | POA: Diagnosis not present

## 2022-12-24 DIAGNOSIS — Z6831 Body mass index (BMI) 31.0-31.9, adult: Secondary | ICD-10-CM | POA: Diagnosis not present

## 2022-12-24 DIAGNOSIS — F324 Major depressive disorder, single episode, in partial remission: Secondary | ICD-10-CM | POA: Diagnosis not present

## 2022-12-24 DIAGNOSIS — I10 Essential (primary) hypertension: Secondary | ICD-10-CM | POA: Diagnosis not present

## 2022-12-24 DIAGNOSIS — E039 Hypothyroidism, unspecified: Secondary | ICD-10-CM | POA: Diagnosis not present

## 2022-12-24 DIAGNOSIS — Z0000 Encounter for general adult medical examination without abnormal findings: Secondary | ICD-10-CM | POA: Diagnosis not present

## 2022-12-24 DIAGNOSIS — I129 Hypertensive chronic kidney disease with stage 1 through stage 4 chronic kidney disease, or unspecified chronic kidney disease: Secondary | ICD-10-CM | POA: Diagnosis not present

## 2022-12-24 DIAGNOSIS — E1122 Type 2 diabetes mellitus with diabetic chronic kidney disease: Secondary | ICD-10-CM | POA: Diagnosis not present

## 2022-12-24 DIAGNOSIS — E119 Type 2 diabetes mellitus without complications: Secondary | ICD-10-CM | POA: Diagnosis not present

## 2022-12-24 DIAGNOSIS — Z23 Encounter for immunization: Secondary | ICD-10-CM | POA: Diagnosis not present

## 2022-12-24 DIAGNOSIS — N183 Chronic kidney disease, stage 3 unspecified: Secondary | ICD-10-CM | POA: Diagnosis not present

## 2023-01-07 DIAGNOSIS — S93401A Sprain of unspecified ligament of right ankle, initial encounter: Secondary | ICD-10-CM | POA: Diagnosis not present

## 2023-01-15 DIAGNOSIS — S93401A Sprain of unspecified ligament of right ankle, initial encounter: Secondary | ICD-10-CM | POA: Diagnosis not present

## 2023-11-02 ENCOUNTER — Other Ambulatory Visit: Payer: Self-pay | Admitting: Family Medicine

## 2023-11-02 DIAGNOSIS — Z1231 Encounter for screening mammogram for malignant neoplasm of breast: Secondary | ICD-10-CM

## 2023-11-22 ENCOUNTER — Ambulatory Visit
Admission: RE | Admit: 2023-11-22 | Discharge: 2023-11-22 | Disposition: A | Discharge status: Home / Self Care | Source: Ambulatory Visit | Attending: Family Medicine | Admitting: Family Medicine

## 2023-11-22 DIAGNOSIS — Z1231 Encounter for screening mammogram for malignant neoplasm of breast: Secondary | ICD-10-CM | POA: Diagnosis present
